# Patient Record
Sex: Male | Born: 1940 | Race: Black or African American | Hispanic: No | Marital: Married | Smoking: Former smoker
Health system: Southern US, Community
[De-identification: ages and names within clinical notes are randomized; demographics above are authoritative.]

## PROBLEM LIST (undated history)

## (undated) DIAGNOSIS — R310 Gross hematuria: Secondary | ICD-10-CM

## (undated) DIAGNOSIS — R7881 Bacteremia: Secondary | ICD-10-CM

## (undated) DIAGNOSIS — M3390 Dermatopolymyositis, unspecified, organ involvement unspecified: Secondary | ICD-10-CM

## (undated) DIAGNOSIS — Z992 Dependence on renal dialysis: Secondary | ICD-10-CM

## (undated) DIAGNOSIS — R972 Elevated prostate specific antigen [PSA]: Secondary | ICD-10-CM

## (undated) DIAGNOSIS — N289 Disorder of kidney and ureter, unspecified: Secondary | ICD-10-CM

## (undated) DIAGNOSIS — C61 Malignant neoplasm of prostate: Secondary | ICD-10-CM

## (undated) DIAGNOSIS — N3289 Other specified disorders of bladder: Secondary | ICD-10-CM

## (undated) DIAGNOSIS — N529 Male erectile dysfunction, unspecified: Secondary | ICD-10-CM

## (undated) DIAGNOSIS — I10 Essential (primary) hypertension: Secondary | ICD-10-CM

## (undated) DIAGNOSIS — R350 Frequency of micturition: Secondary | ICD-10-CM

## (undated) DIAGNOSIS — N400 Enlarged prostate without lower urinary tract symptoms: Secondary | ICD-10-CM

## (undated) DIAGNOSIS — R300 Dysuria: Secondary | ICD-10-CM

## (undated) HISTORY — DX: Elevated prostate specific antigen (PSA): R97.20

## (undated) HISTORY — DX: Bacteremia: R78.81

## (undated) HISTORY — DX: Essential (primary) hypertension: I10

## (undated) HISTORY — DX: Dermatopolymyositis, unspecified, organ involvement unspecified: M33.90

## (undated) HISTORY — PX: COLON RESECTION: SHX5231

## (undated) HISTORY — DX: Frequency of micturition: R35.0

## (undated) HISTORY — DX: Other specified disorders of bladder: N32.89

## (undated) HISTORY — DX: Dysuria: R30.0

## (undated) HISTORY — DX: Disorder of kidney and ureter, unspecified: N28.9

## (undated) HISTORY — DX: Male erectile dysfunction, unspecified: N52.9

## (undated) HISTORY — DX: Benign prostatic hyperplasia without lower urinary tract symptoms: N40.0

## (undated) HISTORY — DX: Gross hematuria: R31.0

## (undated) HISTORY — DX: Malignant neoplasm of prostate: C61

## (undated) HISTORY — DX: Dependence on renal dialysis: Z99.2

## (undated) HISTORY — PX: PROSTATE SURGERY: SHX751

---

## 2006-08-02 ENCOUNTER — Ambulatory Visit: Payer: Self-pay | Admitting: Internal Medicine

## 2006-08-29 ENCOUNTER — Ambulatory Visit: Payer: Self-pay | Admitting: Internal Medicine

## 2007-01-24 ENCOUNTER — Ambulatory Visit: Payer: Self-pay | Admitting: Gastroenterology

## 2007-02-04 ENCOUNTER — Other Ambulatory Visit: Payer: Self-pay

## 2007-02-04 ENCOUNTER — Ambulatory Visit: Payer: Self-pay | Admitting: General Surgery

## 2007-02-08 ENCOUNTER — Inpatient Hospital Stay: Payer: Self-pay | Admitting: General Surgery

## 2007-03-04 ENCOUNTER — Ambulatory Visit: Payer: Self-pay | Admitting: Cardiology

## 2007-06-01 ENCOUNTER — Ambulatory Visit: Payer: Self-pay | Admitting: Family Medicine

## 2007-06-02 ENCOUNTER — Ambulatory Visit: Payer: Self-pay | Admitting: Family Medicine

## 2007-07-01 ENCOUNTER — Ambulatory Visit: Payer: Self-pay | Admitting: Internal Medicine

## 2007-08-14 ENCOUNTER — Ambulatory Visit: Payer: Self-pay | Admitting: Family Medicine

## 2007-08-15 ENCOUNTER — Inpatient Hospital Stay: Payer: Self-pay | Admitting: Internal Medicine

## 2008-08-14 ENCOUNTER — Ambulatory Visit: Payer: Self-pay | Admitting: Internal Medicine

## 2009-06-20 ENCOUNTER — Ambulatory Visit: Payer: Self-pay | Admitting: Internal Medicine

## 2009-06-25 ENCOUNTER — Ambulatory Visit: Payer: Self-pay | Admitting: Radiation Oncology

## 2009-07-19 ENCOUNTER — Ambulatory Visit: Payer: Self-pay | Admitting: Radiation Oncology

## 2009-07-25 ENCOUNTER — Ambulatory Visit: Payer: Self-pay | Admitting: Radiation Oncology

## 2009-08-25 ENCOUNTER — Ambulatory Visit: Payer: Self-pay | Admitting: Radiation Oncology

## 2009-09-20 ENCOUNTER — Ambulatory Visit: Payer: Self-pay | Admitting: Family Medicine

## 2009-09-24 ENCOUNTER — Ambulatory Visit: Payer: Self-pay | Admitting: Radiation Oncology

## 2009-09-29 ENCOUNTER — Ambulatory Visit: Payer: Self-pay | Admitting: Radiation Oncology

## 2009-10-01 ENCOUNTER — Ambulatory Visit: Payer: Self-pay | Admitting: Radiation Oncology

## 2009-10-13 ENCOUNTER — Ambulatory Visit: Payer: Self-pay | Admitting: Urology

## 2009-10-25 ENCOUNTER — Ambulatory Visit: Payer: Self-pay | Admitting: Radiation Oncology

## 2009-11-25 ENCOUNTER — Ambulatory Visit: Payer: Self-pay | Admitting: Radiation Oncology

## 2010-02-24 ENCOUNTER — Ambulatory Visit: Payer: Self-pay | Admitting: Radiation Oncology

## 2010-02-25 LAB — PSA

## 2010-03-27 ENCOUNTER — Ambulatory Visit: Payer: Self-pay | Admitting: Radiation Oncology

## 2010-07-24 ENCOUNTER — Ambulatory Visit: Payer: Self-pay | Admitting: Family Medicine

## 2010-08-08 ENCOUNTER — Ambulatory Visit: Payer: Self-pay | Admitting: Ophthalmology

## 2010-08-16 ENCOUNTER — Ambulatory Visit: Payer: Self-pay | Admitting: Ophthalmology

## 2010-08-26 ENCOUNTER — Ambulatory Visit: Payer: Self-pay | Admitting: Radiation Oncology

## 2010-08-27 LAB — PSA

## 2010-09-25 ENCOUNTER — Ambulatory Visit: Payer: Self-pay | Admitting: Radiation Oncology

## 2010-10-12 ENCOUNTER — Ambulatory Visit: Payer: Self-pay | Admitting: Ophthalmology

## 2010-10-24 ENCOUNTER — Ambulatory Visit: Payer: Self-pay | Admitting: Ophthalmology

## 2011-03-03 ENCOUNTER — Ambulatory Visit: Payer: Self-pay | Admitting: Radiation Oncology

## 2011-03-04 LAB — PSA: PSA: 1.8 ng/mL (ref 0.0–4.0)

## 2011-03-28 ENCOUNTER — Ambulatory Visit: Payer: Self-pay | Admitting: Radiation Oncology

## 2011-07-11 ENCOUNTER — Ambulatory Visit: Payer: Self-pay | Admitting: Nephrology

## 2011-07-21 ENCOUNTER — Ambulatory Visit: Payer: Self-pay | Admitting: Internal Medicine

## 2011-09-20 ENCOUNTER — Ambulatory Visit: Payer: Self-pay | Admitting: Radiation Oncology

## 2011-09-25 ENCOUNTER — Ambulatory Visit: Payer: Self-pay | Admitting: Radiation Oncology

## 2011-11-24 ENCOUNTER — Ambulatory Visit: Payer: Self-pay | Admitting: Gastroenterology

## 2012-01-06 ENCOUNTER — Emergency Department: Payer: Self-pay | Admitting: Emergency Medicine

## 2012-01-06 LAB — COMPREHENSIVE METABOLIC PANEL
Albumin: 3.3 g/dL — ABNORMAL LOW (ref 3.4–5.0)
Anion Gap: 10 (ref 7–16)
BUN: 30 mg/dL — ABNORMAL HIGH (ref 7–18)
Calcium, Total: 8.2 mg/dL — ABNORMAL LOW (ref 8.5–10.1)
Chloride: 99 mmol/L (ref 98–107)
Co2: 28 mmol/L (ref 21–32)
EGFR (African American): 9 — ABNORMAL LOW
EGFR (Non-African Amer.): 7 — ABNORMAL LOW
Glucose: 133 mg/dL — ABNORMAL HIGH (ref 65–99)
Potassium: 3.6 mmol/L (ref 3.5–5.1)
SGOT(AST): 13 U/L — ABNORMAL LOW (ref 15–37)
SGPT (ALT): 22 U/L (ref 12–78)
Total Protein: 7.5 g/dL (ref 6.4–8.2)

## 2012-01-06 LAB — URINALYSIS, COMPLETE
Bacteria: NONE SEEN
Blood: NEGATIVE
Glucose,UR: NEGATIVE mg/dL (ref 0–75)
Leukocyte Esterase: NEGATIVE
Ph: 7 (ref 4.5–8.0)
Protein: 100
RBC,UR: 1 /HPF (ref 0–5)
Specific Gravity: 1.014 (ref 1.003–1.030)
Squamous Epithelial: 1
WBC UR: 1 /HPF (ref 0–5)

## 2012-01-06 LAB — CBC
HGB: 10.7 g/dL — ABNORMAL LOW (ref 13.0–18.0)
MCV: 96 fL (ref 80–100)
Platelet: 162 10*3/uL (ref 150–440)
RBC: 3.36 10*6/uL — ABNORMAL LOW (ref 4.40–5.90)
RDW: 14.8 % — ABNORMAL HIGH (ref 11.5–14.5)
WBC: 9.7 10*3/uL (ref 3.8–10.6)

## 2012-01-07 LAB — URINE CULTURE

## 2012-01-12 LAB — CULTURE, BLOOD (SINGLE)

## 2012-04-05 ENCOUNTER — Other Ambulatory Visit: Payer: Self-pay | Admitting: Family Medicine

## 2012-04-06 ENCOUNTER — Inpatient Hospital Stay: Payer: Self-pay | Admitting: Internal Medicine

## 2012-04-06 LAB — CBC WITH DIFFERENTIAL/PLATELET
Basophil #: 0 10*3/uL (ref 0.0–0.1)
Basophil %: 0.3 %
HCT: 31 % — ABNORMAL LOW (ref 40.0–52.0)
MCH: 31.3 pg (ref 26.0–34.0)
Monocyte %: 7.8 %
Neutrophil %: 82.4 %
Platelet: 161 10*3/uL (ref 150–440)
RDW: 14.3 % (ref 11.5–14.5)
WBC: 12.6 10*3/uL — ABNORMAL HIGH (ref 3.8–10.6)

## 2012-04-06 LAB — COMPREHENSIVE METABOLIC PANEL
Alkaline Phosphatase: 76 U/L (ref 50–136)
Calcium, Total: 8.3 mg/dL — ABNORMAL LOW (ref 8.5–10.1)
Co2: 30 mmol/L (ref 21–32)
EGFR (African American): 8 — ABNORMAL LOW
EGFR (Non-African Amer.): 6 — ABNORMAL LOW
Glucose: 98 mg/dL (ref 65–99)
Potassium: 3.8 mmol/L (ref 3.5–5.1)
SGOT(AST): 20 U/L (ref 15–37)
Sodium: 138 mmol/L (ref 136–145)
Total Protein: 8.1 g/dL (ref 6.4–8.2)

## 2012-04-06 LAB — URINALYSIS, COMPLETE
Bacteria: NONE SEEN
Glucose,UR: NEGATIVE mg/dL (ref 0–75)
Hyaline Cast: 1
Ketone: NEGATIVE
Leukocyte Esterase: NEGATIVE
Nitrite: NEGATIVE
Ph: 7 (ref 4.5–8.0)
Protein: 100
Specific Gravity: 1.013 (ref 1.003–1.030)
Squamous Epithelial: 1
WBC UR: 2 /HPF (ref 0–5)

## 2012-04-06 LAB — SEDIMENTATION RATE: Erythrocyte Sed Rate: 108 mm/hr — ABNORMAL HIGH (ref 0–20)

## 2012-04-08 LAB — RENAL FUNCTION PANEL
Albumin: 2.7 g/dL — ABNORMAL LOW (ref 3.4–5.0)
Anion Gap: 13 (ref 7–16)
BUN: 65 mg/dL — ABNORMAL HIGH (ref 7–18)
Creatinine: 10.78 mg/dL — ABNORMAL HIGH (ref 0.60–1.30)
EGFR (Non-African Amer.): 4 — ABNORMAL LOW
Glucose: 102 mg/dL — ABNORMAL HIGH (ref 65–99)
Osmolality: 295 (ref 275–301)
Potassium: 3.5 mmol/L (ref 3.5–5.1)

## 2012-04-08 LAB — CBC WITH DIFFERENTIAL/PLATELET
Eosinophil #: 0.2 10*3/uL (ref 0.0–0.7)
HCT: 27.2 % — ABNORMAL LOW (ref 40.0–52.0)
HGB: 9.1 g/dL — ABNORMAL LOW (ref 13.0–18.0)
Lymphocyte #: 1.5 10*3/uL (ref 1.0–3.6)
Lymphocyte %: 19.6 %
MCV: 96 fL (ref 80–100)
Monocyte %: 9.7 %
Neutrophil #: 5.2 10*3/uL (ref 1.4–6.5)
Neutrophil %: 67 %
Platelet: 159 10*3/uL (ref 150–440)
WBC: 7.8 10*3/uL (ref 3.8–10.6)

## 2012-04-08 LAB — CULTURE, BLOOD (SINGLE)

## 2012-04-09 LAB — CULTURE, BLOOD (SINGLE)

## 2012-04-10 LAB — CBC WITH DIFFERENTIAL/PLATELET
Basophil #: 0.1 10*3/uL (ref 0.0–0.1)
Eosinophil #: 0.2 10*3/uL (ref 0.0–0.7)
HGB: 9.5 g/dL — ABNORMAL LOW (ref 13.0–18.0)
Lymphocyte #: 1.6 10*3/uL (ref 1.0–3.6)
Lymphocyte %: 20.8 %
MCH: 30.8 pg (ref 26.0–34.0)
MCHC: 32.3 g/dL (ref 32.0–36.0)
MCV: 95 fL (ref 80–100)
Monocyte #: 0.7 x10 3/mm (ref 0.2–1.0)
Monocyte %: 9.2 %
RBC: 3.07 10*6/uL — ABNORMAL LOW (ref 4.40–5.90)
WBC: 7.5 10*3/uL (ref 3.8–10.6)

## 2012-04-10 LAB — CULTURE, BLOOD (SINGLE)

## 2012-04-14 LAB — CULTURE, BLOOD (SINGLE)

## 2012-05-25 ENCOUNTER — Inpatient Hospital Stay: Payer: Self-pay | Admitting: Internal Medicine

## 2012-05-25 LAB — COMPREHENSIVE METABOLIC PANEL
Albumin: 3.4 g/dL (ref 3.4–5.0)
Alkaline Phosphatase: 66 U/L (ref 50–136)
Anion Gap: 8 (ref 7–16)
Bilirubin,Total: 0.5 mg/dL (ref 0.2–1.0)
Calcium, Total: 8.2 mg/dL — ABNORMAL LOW (ref 8.5–10.1)
Co2: 29 mmol/L (ref 21–32)
EGFR (African American): 6 — ABNORMAL LOW
Glucose: 117 mg/dL — ABNORMAL HIGH (ref 65–99)
SGOT(AST): 16 U/L (ref 15–37)
SGPT (ALT): 16 U/L (ref 12–78)

## 2012-05-25 LAB — URINALYSIS, COMPLETE
Blood: NEGATIVE
Glucose,UR: NEGATIVE mg/dL (ref 0–75)
Ketone: NEGATIVE
Ph: 7 (ref 4.5–8.0)
Protein: 100
RBC,UR: <1 /HPF (ref 0–5)
Squamous Epithelial: <1
WBC UR: 3 /HPF (ref 0–5)

## 2012-05-25 LAB — CBC
HCT: 30.6 % — ABNORMAL LOW (ref 40.0–52.0)
HGB: 10.1 g/dL — ABNORMAL LOW (ref 13.0–18.0)
MCV: 97 fL (ref 80–100)
Platelet: 160 10*3/uL (ref 150–440)
RDW: 14.9 % — ABNORMAL HIGH (ref 11.5–14.5)

## 2012-05-26 LAB — BASIC METABOLIC PANEL
BUN: 49 mg/dL — ABNORMAL HIGH (ref 7–18)
Calcium, Total: 7.9 mg/dL — ABNORMAL LOW (ref 8.5–10.1)
Chloride: 103 mmol/L (ref 98–107)
Co2: 27 mmol/L (ref 21–32)
Creatinine: 10.2 mg/dL — ABNORMAL HIGH (ref 0.60–1.30)
EGFR (Non-African Amer.): 5 — ABNORMAL LOW
Glucose: 105 mg/dL — ABNORMAL HIGH (ref 65–99)
Osmolality: 291 (ref 275–301)
Potassium: 3.9 mmol/L (ref 3.5–5.1)
Sodium: 139 mmol/L (ref 136–145)

## 2012-05-26 LAB — CBC WITH DIFFERENTIAL/PLATELET
Basophil %: 0.4 %
Eosinophil %: 0.5 %
HCT: 31.6 % — ABNORMAL LOW (ref 40.0–52.0)
Lymphocyte #: 0.6 10*3/uL — ABNORMAL LOW (ref 1.0–3.6)
MCV: 96 fL (ref 80–100)
Monocyte #: 0.4 x10 3/mm (ref 0.2–1.0)
Monocyte %: 4.4 %
Platelet: 156 10*3/uL (ref 150–440)
RDW: 14.8 % — ABNORMAL HIGH (ref 11.5–14.5)
WBC: 9.9 10*3/uL (ref 3.8–10.6)

## 2012-05-26 LAB — RAPID INFLUENZA A&B ANTIGENS

## 2012-05-27 LAB — CBC WITH DIFFERENTIAL/PLATELET
Basophil %: 1 %
Eosinophil %: 1.7 %
HCT: 27.6 % — ABNORMAL LOW (ref 40.0–52.0)
HGB: 9.3 g/dL — ABNORMAL LOW (ref 13.0–18.0)
MCHC: 33.8 g/dL (ref 32.0–36.0)
MCV: 96 fL (ref 80–100)
Platelet: 147 10*3/uL — ABNORMAL LOW (ref 150–440)
WBC: 9.4 10*3/uL (ref 3.8–10.6)

## 2012-05-27 LAB — PHOSPHORUS: Phosphorus: 4 mg/dL (ref 2.5–4.9)

## 2012-05-27 LAB — BASIC METABOLIC PANEL
Anion Gap: 9 (ref 7–16)
EGFR (African American): 4 — ABNORMAL LOW
EGFR (Non-African Amer.): 4 — ABNORMAL LOW
Osmolality: 293 (ref 275–301)
Potassium: 4 mmol/L (ref 3.5–5.1)

## 2012-05-27 LAB — URINE CULTURE

## 2012-05-28 LAB — CBC WITH DIFFERENTIAL/PLATELET
Basophil #: 0 10*3/uL (ref 0.0–0.1)
Basophil %: 0.7 %
Eosinophil #: 0.3 10*3/uL (ref 0.0–0.7)
Eosinophil %: 3.9 %
HGB: 10.1 g/dL — ABNORMAL LOW (ref 13.0–18.0)
Lymphocyte #: 1.6 10*3/uL (ref 1.0–3.6)
Lymphocyte %: 21.7 %
MCH: 33.5 pg (ref 26.0–34.0)
MCV: 96 fL (ref 80–100)
Monocyte #: 0.9 x10 3/mm (ref 0.2–1.0)
Monocyte %: 12.3 %
Platelet: 168 10*3/uL (ref 150–440)
RBC: 3.02 10*6/uL — ABNORMAL LOW (ref 4.40–5.90)
WBC: 7.4 10*3/uL (ref 3.8–10.6)

## 2012-05-28 LAB — PHOSPHORUS: Phosphorus: 3.9 mg/dL (ref 2.5–4.9)

## 2012-05-28 LAB — CULTURE, BLOOD (SINGLE)

## 2012-05-29 LAB — CULTURE, BLOOD (SINGLE)

## 2012-05-31 LAB — CULTURE, BLOOD (SINGLE)

## 2012-08-25 ENCOUNTER — Ambulatory Visit: Payer: Self-pay | Admitting: Radiation Oncology

## 2012-09-23 ENCOUNTER — Ambulatory Visit: Payer: Self-pay

## 2012-10-18 ENCOUNTER — Ambulatory Visit: Payer: Self-pay | Admitting: Cardiology

## 2012-11-06 ENCOUNTER — Ambulatory Visit: Payer: Self-pay | Admitting: Urology

## 2012-11-07 LAB — URINE CULTURE

## 2012-11-13 ENCOUNTER — Ambulatory Visit: Payer: Self-pay | Admitting: Urology

## 2012-11-13 LAB — POTASSIUM: Potassium: 4.5 mmol/L (ref 3.5–5.1)

## 2012-11-23 ENCOUNTER — Other Ambulatory Visit: Payer: Self-pay | Admitting: Internal Medicine

## 2013-02-12 ENCOUNTER — Ambulatory Visit: Payer: Self-pay | Admitting: Urology

## 2013-08-13 ENCOUNTER — Observation Stay: Payer: Self-pay | Admitting: Internal Medicine

## 2013-08-13 LAB — CBC
HCT: 34.5 % — ABNORMAL LOW (ref 40.0–52.0)
HGB: 11.3 g/dL — AB (ref 13.0–18.0)
MCH: 31.2 pg (ref 26.0–34.0)
MCHC: 32.7 g/dL (ref 32.0–36.0)
MCV: 95 fL (ref 80–100)
Platelet: 177 10*3/uL (ref 150–440)
RBC: 3.62 10*6/uL — ABNORMAL LOW (ref 4.40–5.90)
RDW: 14.1 % (ref 11.5–14.5)
WBC: 7.2 10*3/uL (ref 3.8–10.6)

## 2013-08-13 LAB — BASIC METABOLIC PANEL
Anion Gap: 8 (ref 7–16)
BUN: 49 mg/dL — ABNORMAL HIGH (ref 7–18)
CO2: 26 mmol/L (ref 21–32)
Calcium, Total: 9 mg/dL (ref 8.5–10.1)
Chloride: 105 mmol/L (ref 98–107)
Creatinine: 11.41 mg/dL — ABNORMAL HIGH (ref 0.60–1.30)
EGFR (African American): 5 — ABNORMAL LOW
GFR CALC NON AF AMER: 4 — AB
Glucose: 98 mg/dL (ref 65–99)
OSMOLALITY: 290 (ref 275–301)
Potassium: 3.8 mmol/L (ref 3.5–5.1)
SODIUM: 139 mmol/L (ref 136–145)

## 2013-08-13 LAB — PRO B NATRIURETIC PEPTIDE: B-Type Natriuretic Peptide: 6321 pg/mL — ABNORMAL HIGH (ref 0–125)

## 2013-08-13 LAB — TROPONIN I: Troponin-I: 0.1 ng/mL — ABNORMAL HIGH

## 2013-08-13 LAB — CK-MB

## 2013-08-14 LAB — TROPONIN I
Troponin-I: 0.09 ng/mL — ABNORMAL HIGH
Troponin-I: 0.08 ng/mL — ABNORMAL HIGH

## 2013-08-14 LAB — LIPID PANEL
Cholesterol: 142 mg/dL
HDL Cholesterol: 42 mg/dL
Ldl Cholesterol, Calc: 80 mg/dL
Triglycerides: 100 mg/dL
VLDL Cholesterol, Calc: 20 mg/dL

## 2013-08-14 LAB — CK-MB
CK-MB: <0.5 ng/mL — ABNORMAL LOW (ref 0.5–3.6)
CK-MB: <0.5 ng/mL — ABNORMAL LOW

## 2013-08-14 LAB — PHOSPHORUS: PHOSPHORUS: 4.2 mg/dL (ref 2.5–4.9)

## 2013-08-14 LAB — D-DIMER(ARMC): D-Dimer: 1015 ng/ml

## 2013-08-27 ENCOUNTER — Ambulatory Visit: Payer: Self-pay | Admitting: Urology

## 2013-10-05 ENCOUNTER — Inpatient Hospital Stay: Payer: Self-pay | Admitting: Internal Medicine

## 2013-10-05 LAB — CBC
HCT: 28.6 % — ABNORMAL LOW (ref 40.0–52.0)
HGB: 9.3 g/dL — ABNORMAL LOW (ref 13.0–18.0)
MCH: 30.6 pg (ref 26.0–34.0)
MCHC: 32.7 g/dL (ref 32.0–36.0)
MCV: 94 fL (ref 80–100)
Platelet: 180 10*3/uL (ref 150–440)
RBC: 3.05 10*6/uL — AB (ref 4.40–5.90)
RDW: 14.8 % — AB (ref 11.5–14.5)
WBC: 12.9 10*3/uL — ABNORMAL HIGH (ref 3.8–10.6)

## 2013-10-05 LAB — BASIC METABOLIC PANEL
Anion Gap: 7 (ref 7–16)
BUN: 32 mg/dL — AB (ref 7–18)
CHLORIDE: 101 mmol/L (ref 98–107)
Calcium, Total: 8 mg/dL — ABNORMAL LOW (ref 8.5–10.1)
Co2: 31 mmol/L (ref 21–32)
Creatinine: 8.8 mg/dL — ABNORMAL HIGH (ref 0.60–1.30)
EGFR (African American): 6 — ABNORMAL LOW
EGFR (Non-African Amer.): 5 — ABNORMAL LOW
GLUCOSE: 118 mg/dL — AB (ref 65–99)
OSMOLALITY: 286 (ref 275–301)
Potassium: 3.9 mmol/L (ref 3.5–5.1)
SODIUM: 139 mmol/L (ref 136–145)

## 2013-10-05 LAB — CK-MB
CK-MB: 26.6 ng/mL — ABNORMAL HIGH (ref 0.5–3.6)
CK-MB: 32.7 ng/mL — ABNORMAL HIGH (ref 0.5–3.6)
CK-MB: 35.3 ng/mL — ABNORMAL HIGH (ref 0.5–3.6)

## 2013-10-05 LAB — TROPONIN I
TROPONIN-I: 36 ng/mL — AB
Troponin-I: 14 ng/mL — ABNORMAL HIGH
Troponin-I: 24 ng/mL — ABNORMAL HIGH

## 2013-10-05 LAB — PHOSPHORUS: Phosphorus: 3.7 mg/dL (ref 2.5–4.9)

## 2013-10-05 LAB — PROTIME-INR
INR: 1.1
Prothrombin Time: 14 secs (ref 11.5–14.7)

## 2013-10-05 LAB — APTT: ACTIVATED PTT: 36.2 s — AB (ref 23.6–35.9)

## 2013-10-05 LAB — HEPARIN LEVEL (UNFRACTIONATED): Anti-Xa(Unfractionated): 0.16 [IU]/mL — ABNORMAL LOW (ref 0.30–0.70)

## 2013-10-06 LAB — COMPREHENSIVE METABOLIC PANEL
ALK PHOS: 41 U/L — AB
ALT: 18 U/L (ref 12–78)
Albumin: 2.2 g/dL — ABNORMAL LOW (ref 3.4–5.0)
Anion Gap: 9 (ref 7–16)
BILIRUBIN TOTAL: 0.8 mg/dL (ref 0.2–1.0)
BUN: 30 mg/dL — ABNORMAL HIGH (ref 7–18)
Calcium, Total: 8.1 mg/dL — ABNORMAL LOW (ref 8.5–10.1)
Chloride: 99 mmol/L (ref 98–107)
Co2: 31 mmol/L (ref 21–32)
Creatinine: 7.53 mg/dL — ABNORMAL HIGH (ref 0.60–1.30)
EGFR (African American): 8 — ABNORMAL LOW
EGFR (Non-African Amer.): 6 — ABNORMAL LOW
Glucose: 108 mg/dL — ABNORMAL HIGH (ref 65–99)
OSMOLALITY: 284 (ref 275–301)
Potassium: 4.1 mmol/L (ref 3.5–5.1)
SGOT(AST): 43 U/L — ABNORMAL HIGH (ref 15–37)
SODIUM: 139 mmol/L (ref 136–145)
TOTAL PROTEIN: 6.3 g/dL — AB (ref 6.4–8.2)

## 2013-10-06 LAB — CBC WITH DIFFERENTIAL/PLATELET
BASOS ABS: 0 10*3/uL (ref 0.0–0.1)
BASOS PCT: 0.3 %
EOS ABS: 0 10*3/uL (ref 0.0–0.7)
Eosinophil %: 0.4 %
HCT: 26.3 % — AB (ref 40.0–52.0)
HGB: 8.5 g/dL — AB (ref 13.0–18.0)
LYMPHS ABS: 1.1 10*3/uL (ref 1.0–3.6)
Lymphocyte %: 9.4 %
MCH: 30.1 pg (ref 26.0–34.0)
MCHC: 32.3 g/dL (ref 32.0–36.0)
MCV: 93 fL (ref 80–100)
Monocyte #: 0.8 x10 3/mm (ref 0.2–1.0)
Monocyte %: 7.3 %
NEUTROS PCT: 82.6 %
Neutrophil #: 9.5 10*3/uL — ABNORMAL HIGH (ref 1.4–6.5)
Platelet: 156 10*3/uL (ref 150–440)
RBC: 2.82 10*6/uL — ABNORMAL LOW (ref 4.40–5.90)
RDW: 14.7 % — AB (ref 11.5–14.5)
WBC: 11.5 10*3/uL — ABNORMAL HIGH (ref 3.8–10.6)

## 2013-10-06 LAB — PHOSPHORUS: PHOSPHORUS: 4.4 mg/dL (ref 2.5–4.9)

## 2013-10-06 LAB — HEPARIN LEVEL (UNFRACTIONATED): ANTI-XA(UNFRACTIONATED): 0.34 [IU]/mL (ref 0.30–0.70)

## 2013-10-07 LAB — PHOSPHORUS: Phosphorus: 5.8 mg/dL — ABNORMAL HIGH (ref 2.5–4.9)

## 2013-10-09 LAB — CULTURE, BLOOD (SINGLE)

## 2013-10-10 LAB — CULTURE, BLOOD (SINGLE)

## 2013-10-12 ENCOUNTER — Inpatient Hospital Stay: Payer: Self-pay | Admitting: Internal Medicine

## 2013-10-12 LAB — CBC
HCT: 32.5 % — ABNORMAL LOW (ref 40.0–52.0)
HGB: 10.1 g/dL — AB (ref 13.0–18.0)
MCH: 29.4 pg (ref 26.0–34.0)
MCHC: 31 g/dL — ABNORMAL LOW (ref 32.0–36.0)
MCV: 95 fL (ref 80–100)
Platelet: 321 10*3/uL (ref 150–440)
RBC: 3.42 10*6/uL — ABNORMAL LOW (ref 4.40–5.90)
RDW: 15.5 % — ABNORMAL HIGH (ref 11.5–14.5)
WBC: 17.6 10*3/uL — ABNORMAL HIGH (ref 3.8–10.6)

## 2013-10-12 LAB — COMPREHENSIVE METABOLIC PANEL
ALT: 19 U/L (ref 12–78)
ANION GAP: 13 (ref 7–16)
AST: 25 U/L (ref 15–37)
Albumin: 2.9 g/dL — ABNORMAL LOW (ref 3.4–5.0)
Alkaline Phosphatase: 64 U/L
BILIRUBIN TOTAL: 0.4 mg/dL (ref 0.2–1.0)
BUN: 67 mg/dL — ABNORMAL HIGH (ref 7–18)
CO2: 24 mmol/L (ref 21–32)
Calcium, Total: 8.6 mg/dL (ref 8.5–10.1)
Chloride: 103 mmol/L (ref 98–107)
Creatinine: 12.24 mg/dL — ABNORMAL HIGH (ref 0.60–1.30)
EGFR (African American): 4 — ABNORMAL LOW
EGFR (Non-African Amer.): 4 — ABNORMAL LOW
Glucose: 148 mg/dL — ABNORMAL HIGH (ref 65–99)
Osmolality: 302 (ref 275–301)
Potassium: 4.5 mmol/L (ref 3.5–5.1)
Sodium: 140 mmol/L (ref 136–145)
Total Protein: 7.8 g/dL (ref 6.4–8.2)

## 2013-10-12 LAB — CK-MB
CK-MB: 0.9 ng/mL (ref 0.5–3.6)
CK-MB: 1.5 ng/mL (ref 0.5–3.6)
CK-MB: 1.9 ng/mL (ref 0.5–3.6)

## 2013-10-12 LAB — CK TOTAL AND CKMB (NOT AT ARMC)
CK, TOTAL: 90 U/L
CK-MB: 0.8 ng/mL (ref 0.5–3.6)

## 2013-10-12 LAB — TROPONIN I: TROPONIN-I: 7.4 ng/mL — AB

## 2013-10-12 LAB — PRO B NATRIURETIC PEPTIDE: B-TYPE NATIURETIC PEPTID: 31721 pg/mL — AB (ref 0–125)

## 2013-10-13 DIAGNOSIS — R0602 Shortness of breath: Secondary | ICD-10-CM

## 2013-10-13 DIAGNOSIS — J9600 Acute respiratory failure, unspecified whether with hypoxia or hypercapnia: Secondary | ICD-10-CM

## 2013-10-13 LAB — CBC WITH DIFFERENTIAL/PLATELET
BASOS ABS: 0.1 10*3/uL (ref 0.0–0.1)
Basophil %: 0.7 %
EOS PCT: 2.6 %
Eosinophil #: 0.2 10*3/uL (ref 0.0–0.7)
HCT: 27.7 % — AB (ref 40.0–52.0)
HGB: 8.7 g/dL — ABNORMAL LOW (ref 13.0–18.0)
Lymphocyte #: 1.4 10*3/uL (ref 1.0–3.6)
Lymphocyte %: 16.7 %
MCH: 29.6 pg (ref 26.0–34.0)
MCHC: 31.5 g/dL — AB (ref 32.0–36.0)
MCV: 94 fL (ref 80–100)
Monocyte #: 0.7 x10 3/mm (ref 0.2–1.0)
Monocyte %: 8.9 %
NEUTROS ABS: 5.8 10*3/uL (ref 1.4–6.5)
Neutrophil %: 71.1 %
PLATELETS: 220 10*3/uL (ref 150–440)
RBC: 2.95 10*6/uL — ABNORMAL LOW (ref 4.40–5.90)
RDW: 15.1 % — ABNORMAL HIGH (ref 11.5–14.5)
WBC: 8.1 10*3/uL (ref 3.8–10.6)

## 2013-10-13 LAB — URINALYSIS, COMPLETE
BLOOD: NEGATIVE
Bacteria: NONE SEEN
Bilirubin,UR: NEGATIVE
GLUCOSE, UR: NEGATIVE mg/dL (ref 0–75)
KETONE: NEGATIVE
Leukocyte Esterase: NEGATIVE
Nitrite: NEGATIVE
PH: 9 (ref 4.5–8.0)
Protein: 100
Specific Gravity: 1.011 (ref 1.003–1.030)

## 2013-10-13 LAB — BASIC METABOLIC PANEL
ANION GAP: 7 (ref 7–16)
BUN: 39 mg/dL — ABNORMAL HIGH (ref 7–18)
CALCIUM: 8 mg/dL — AB (ref 8.5–10.1)
Chloride: 101 mmol/L (ref 98–107)
Co2: 32 mmol/L (ref 21–32)
Creatinine: 8.35 mg/dL — ABNORMAL HIGH (ref 0.60–1.30)
EGFR (African American): 7 — ABNORMAL LOW
EGFR (Non-African Amer.): 6 — ABNORMAL LOW
Glucose: 89 mg/dL (ref 65–99)
OSMOLALITY: 288 (ref 275–301)
Potassium: 4.1 mmol/L (ref 3.5–5.1)
Sodium: 140 mmol/L (ref 136–145)

## 2013-10-13 LAB — PHOSPHORUS: Phosphorus: 4.1 mg/dL (ref 2.5–4.9)

## 2013-10-13 LAB — VANCOMYCIN, TROUGH: VANCOMYCIN, TROUGH: 19 ug/mL (ref 10–20)

## 2013-10-13 LAB — TROPONIN I: Troponin-I: 5.1 ng/mL — ABNORMAL HIGH

## 2013-10-14 LAB — BASIC METABOLIC PANEL
ANION GAP: 6 — AB (ref 7–16)
BUN: 36 mg/dL — AB (ref 7–18)
Calcium, Total: 8.1 mg/dL — ABNORMAL LOW (ref 8.5–10.1)
Chloride: 99 mmol/L (ref 98–107)
Co2: 35 mmol/L — ABNORMAL HIGH (ref 21–32)
Creatinine: 8.47 mg/dL — ABNORMAL HIGH (ref 0.60–1.30)
EGFR (African American): 7 — ABNORMAL LOW
GFR CALC NON AF AMER: 6 — AB
Glucose: 107 mg/dL — ABNORMAL HIGH (ref 65–99)
Osmolality: 288 (ref 275–301)
POTASSIUM: 3.9 mmol/L (ref 3.5–5.1)
SODIUM: 140 mmol/L (ref 136–145)

## 2013-10-14 LAB — CBC WITH DIFFERENTIAL/PLATELET
Basophil #: 0.1 10*3/uL (ref 0.0–0.1)
Basophil %: 0.8 %
EOS PCT: 1.8 %
Eosinophil #: 0.2 10*3/uL (ref 0.0–0.7)
HCT: 27.2 % — ABNORMAL LOW (ref 40.0–52.0)
HGB: 8.8 g/dL — ABNORMAL LOW (ref 13.0–18.0)
Lymphocyte #: 1.4 10*3/uL (ref 1.0–3.6)
Lymphocyte %: 15.1 %
MCH: 30.2 pg (ref 26.0–34.0)
MCHC: 32.5 g/dL (ref 32.0–36.0)
MCV: 93 fL (ref 80–100)
MONOS PCT: 9.8 %
Monocyte #: 0.9 x10 3/mm (ref 0.2–1.0)
NEUTROS ABS: 6.6 10*3/uL — AB (ref 1.4–6.5)
Neutrophil %: 72.5 %
Platelet: 224 10*3/uL (ref 150–440)
RBC: 2.93 10*6/uL — AB (ref 4.40–5.90)
RDW: 14.8 % — ABNORMAL HIGH (ref 11.5–14.5)
WBC: 9.1 10*3/uL (ref 3.8–10.6)

## 2013-10-14 LAB — URINE CULTURE

## 2013-10-14 LAB — PHOSPHORUS: Phosphorus: 3.5 mg/dL (ref 2.5–4.9)

## 2013-10-16 LAB — VANCOMYCIN, TROUGH: Vancomycin, Trough: 23 ug/mL (ref 10–20)

## 2013-10-16 LAB — PHOSPHORUS: Phosphorus: 3.5 mg/dL (ref 2.5–4.9)

## 2013-10-17 LAB — CULTURE, BLOOD (SINGLE)

## 2013-10-25 ENCOUNTER — Inpatient Hospital Stay: Payer: Self-pay | Admitting: Internal Medicine

## 2013-10-25 ENCOUNTER — Ambulatory Visit: Payer: Self-pay | Admitting: Internal Medicine

## 2013-10-25 LAB — COMPREHENSIVE METABOLIC PANEL
ALBUMIN: 2.6 g/dL — AB (ref 3.4–5.0)
ANION GAP: 10 (ref 7–16)
AST: 13 U/L — AB (ref 15–37)
Alkaline Phosphatase: 45 U/L — ABNORMAL LOW
BILIRUBIN TOTAL: 0.4 mg/dL (ref 0.2–1.0)
BUN: 16 mg/dL (ref 7–18)
CHLORIDE: 99 mmol/L (ref 98–107)
Calcium, Total: 8 mg/dL — ABNORMAL LOW (ref 8.5–10.1)
Co2: 29 mmol/L (ref 21–32)
Creatinine: 4.74 mg/dL — ABNORMAL HIGH (ref 0.60–1.30)
EGFR (African American): 13 — ABNORMAL LOW
GFR CALC NON AF AMER: 11 — AB
Glucose: 111 mg/dL — ABNORMAL HIGH (ref 65–99)
Osmolality: 278 (ref 275–301)
POTASSIUM: 3.7 mmol/L (ref 3.5–5.1)
SGPT (ALT): 13 U/L — ABNORMAL LOW
SODIUM: 138 mmol/L (ref 136–145)
Total Protein: 6.8 g/dL (ref 6.4–8.2)

## 2013-10-25 LAB — CBC
HCT: 25 % — ABNORMAL LOW (ref 40.0–52.0)
HGB: 8.1 g/dL — ABNORMAL LOW (ref 13.0–18.0)
MCH: 30.2 pg (ref 26.0–34.0)
MCHC: 32.4 g/dL (ref 32.0–36.0)
MCV: 93 fL (ref 80–100)
Platelet: 153 10*3/uL (ref 150–440)
RBC: 2.69 10*6/uL — ABNORMAL LOW (ref 4.40–5.90)
RDW: 15.4 % — AB (ref 11.5–14.5)
WBC: 6.7 10*3/uL (ref 3.8–10.6)

## 2013-10-25 LAB — TROPONIN I
TROPONIN-I: 2.2 ng/mL — AB
Troponin-I: 4.4 ng/mL — ABNORMAL HIGH
Troponin-I: 10 ng/mL — ABNORMAL HIGH

## 2013-10-25 LAB — CK TOTAL AND CKMB (NOT AT ARMC)
CK, TOTAL: 173 U/L
CK-MB: 14.3 ng/mL — AB (ref 0.5–3.6)

## 2013-10-25 LAB — CK-MB
CK-MB: 0.9 ng/mL (ref 0.5–3.6)
CK-MB: 4 ng/mL — ABNORMAL HIGH (ref 0.5–3.6)

## 2013-10-25 LAB — GENTAMICIN LEVEL, RANDOM: Gentamicin, Random: 6 ug/mL

## 2013-10-26 LAB — CBC WITH DIFFERENTIAL/PLATELET
BASOS ABS: 0.1 10*3/uL (ref 0.0–0.1)
BASOS PCT: 1 %
EOS ABS: 0.1 10*3/uL (ref 0.0–0.7)
Eosinophil %: 1.6 %
HCT: 22.7 % — AB (ref 40.0–52.0)
HGB: 7.4 g/dL — ABNORMAL LOW (ref 13.0–18.0)
LYMPHS PCT: 19.1 %
Lymphocyte #: 1.3 10*3/uL (ref 1.0–3.6)
MCH: 30.4 pg (ref 26.0–34.0)
MCHC: 32.7 g/dL (ref 32.0–36.0)
MCV: 93 fL (ref 80–100)
MONO ABS: 0.7 x10 3/mm (ref 0.2–1.0)
Monocyte %: 9.7 %
NEUTROS PCT: 68.6 %
Neutrophil #: 4.7 10*3/uL (ref 1.4–6.5)
Platelet: 136 10*3/uL — ABNORMAL LOW (ref 150–440)
RBC: 2.44 10*6/uL — ABNORMAL LOW (ref 4.40–5.90)
RDW: 15.4 % — ABNORMAL HIGH (ref 11.5–14.5)
WBC: 6.8 10*3/uL (ref 3.8–10.6)

## 2013-10-26 LAB — HEPARIN LEVEL (UNFRACTIONATED)
Anti-Xa(Unfractionated): 0.21 IU/mL — ABNORMAL LOW (ref 0.30–0.70)
Anti-Xa(Unfractionated): 0.57 IU/mL (ref 0.30–0.70)

## 2013-10-26 LAB — COMPREHENSIVE METABOLIC PANEL
ALT: 13 U/L — AB
AST: 30 U/L (ref 15–37)
Albumin: 2.4 g/dL — ABNORMAL LOW (ref 3.4–5.0)
Alkaline Phosphatase: 43 U/L — ABNORMAL LOW
Anion Gap: 9 (ref 7–16)
BUN: 23 mg/dL — AB (ref 7–18)
Bilirubin,Total: 0.4 mg/dL (ref 0.2–1.0)
Calcium, Total: 7.8 mg/dL — ABNORMAL LOW (ref 8.5–10.1)
Chloride: 102 mmol/L (ref 98–107)
Co2: 28 mmol/L (ref 21–32)
Creatinine: 6.68 mg/dL — ABNORMAL HIGH (ref 0.60–1.30)
EGFR (African American): 9 — ABNORMAL LOW
EGFR (Non-African Amer.): 7 — ABNORMAL LOW
GLUCOSE: 101 mg/dL — AB (ref 65–99)
OSMOLALITY: 281 (ref 275–301)
Potassium: 4.2 mmol/L (ref 3.5–5.1)
SODIUM: 139 mmol/L (ref 136–145)
Total Protein: 6.4 g/dL (ref 6.4–8.2)

## 2013-10-26 LAB — PROTIME-INR
INR: 1.1
Prothrombin Time: 14.4 secs (ref 11.5–14.7)

## 2013-10-26 LAB — VANCOMYCIN, RANDOM: Vancomycin, Random: 25 ug/mL

## 2013-10-26 LAB — APTT: ACTIVATED PTT: 35.4 s (ref 23.6–35.9)

## 2013-10-27 LAB — BASIC METABOLIC PANEL
Anion Gap: 7 (ref 7–16)
BUN: 40 mg/dL — ABNORMAL HIGH (ref 7–18)
CHLORIDE: 100 mmol/L (ref 98–107)
CO2: 26 mmol/L (ref 21–32)
CREATININE: 9.1 mg/dL — AB (ref 0.60–1.30)
Calcium, Total: 7.9 mg/dL — ABNORMAL LOW (ref 8.5–10.1)
EGFR (African American): 6 — ABNORMAL LOW
GFR CALC NON AF AMER: 5 — AB
Glucose: 115 mg/dL — ABNORMAL HIGH (ref 65–99)
OSMOLALITY: 277 (ref 275–301)
Potassium: 5.1 mmol/L (ref 3.5–5.1)
Sodium: 133 mmol/L — ABNORMAL LOW (ref 136–145)

## 2013-10-27 LAB — CBC WITH DIFFERENTIAL/PLATELET
BASOS ABS: 0 10*3/uL (ref 0.0–0.1)
Basophil %: 0.5 %
EOS ABS: 0 10*3/uL (ref 0.0–0.7)
Eosinophil %: 0.4 %
HCT: 24.2 % — AB (ref 40.0–52.0)
HGB: 7.8 g/dL — ABNORMAL LOW (ref 13.0–18.0)
Lymphocyte #: 1.2 10*3/uL (ref 1.0–3.6)
Lymphocyte %: 13.7 %
MCH: 30.3 pg (ref 26.0–34.0)
MCHC: 32.4 g/dL (ref 32.0–36.0)
MCV: 93 fL (ref 80–100)
Monocyte #: 0.5 x10 3/mm (ref 0.2–1.0)
Monocyte %: 6 %
Neutrophil #: 7.1 10*3/uL — ABNORMAL HIGH (ref 1.4–6.5)
Neutrophil %: 79.4 %
Platelet: 173 10*3/uL (ref 150–440)
RBC: 2.59 10*6/uL — ABNORMAL LOW (ref 4.40–5.90)
RDW: 15.9 % — AB (ref 11.5–14.5)
WBC: 9 10*3/uL (ref 3.8–10.6)

## 2013-10-27 LAB — PHOSPHORUS: Phosphorus: 6.3 mg/dL — ABNORMAL HIGH (ref 2.5–4.9)

## 2013-10-27 LAB — HEPARIN LEVEL (UNFRACTIONATED): ANTI-XA(UNFRACTIONATED): 0.23 [IU]/mL — AB (ref 0.30–0.70)

## 2013-10-27 LAB — GENTAMICIN LEVEL, TROUGH: Gentamicin, Trough: 4.2 ug/mL (ref 0.0–2.0)

## 2013-10-28 LAB — TSH: Thyroid Stimulating Horm: 1.35 u[IU]/mL

## 2013-10-28 LAB — HEPARIN LEVEL (UNFRACTIONATED)
ANTI-XA(UNFRACTIONATED): 0.1 [IU]/mL — AB (ref 0.30–0.70)
Anti-Xa(Unfractionated): <0.1 IU/mL — ABNORMAL LOW (ref 0.30–0.70)

## 2013-10-28 LAB — GENTAMICIN LEVEL, TROUGH: Gentamicin, Trough: 2.3 ug/mL (ref 0.0–2.0)

## 2013-10-28 LAB — HEMATOCRIT: HCT: 23.1 % — AB (ref 40.0–52.0)

## 2013-10-28 LAB — PHOSPHORUS: Phosphorus: 4.7 mg/dL (ref 2.5–4.9)

## 2013-10-29 ENCOUNTER — Ambulatory Visit: Admit: 2013-10-29 | Payer: Self-pay | Admitting: Internal Medicine

## 2013-10-29 ENCOUNTER — Ambulatory Visit (HOSPITAL_COMMUNITY)
Admission: AD | Admit: 2013-10-29 | Discharge: 2013-10-29 | Disposition: A | Discharge status: Home / Self Care | Payer: Medicare Other | Source: Other Acute Inpatient Hospital | Attending: Internal Medicine | Admitting: Internal Medicine

## 2013-10-29 ENCOUNTER — Ambulatory Visit (HOSPITAL_COMMUNITY): Admit: 2013-10-29 | Payer: Medicare Other | Source: Other Acute Inpatient Hospital | Admitting: *Deleted

## 2013-10-29 DIAGNOSIS — R0989 Other specified symptoms and signs involving the circulatory and respiratory systems: Secondary | ICD-10-CM | POA: Insufficient documentation

## 2013-10-29 DIAGNOSIS — R079 Chest pain, unspecified: Secondary | ICD-10-CM | POA: Diagnosis present

## 2013-10-29 DIAGNOSIS — R0609 Other forms of dyspnea: Secondary | ICD-10-CM | POA: Insufficient documentation

## 2013-10-29 LAB — CBC WITH DIFFERENTIAL/PLATELET
Basophil #: 0.1 10*3/uL (ref 0.0–0.1)
Basophil %: 0.7 %
EOS PCT: 1.1 %
Eosinophil #: 0.1 10*3/uL (ref 0.0–0.7)
HCT: 23.4 % — ABNORMAL LOW (ref 40.0–52.0)
HGB: 7.3 g/dL — ABNORMAL LOW (ref 13.0–18.0)
LYMPHS ABS: 1 10*3/uL (ref 1.0–3.6)
LYMPHS PCT: 11.4 %
MCH: 29.6 pg (ref 26.0–34.0)
MCHC: 31.3 g/dL — ABNORMAL LOW (ref 32.0–36.0)
MCV: 95 fL (ref 80–100)
Monocyte #: 0.7 x10 3/mm (ref 0.2–1.0)
Monocyte %: 7.5 %
Neutrophil #: 6.9 10*3/uL — ABNORMAL HIGH (ref 1.4–6.5)
Neutrophil %: 79.3 %
PLATELETS: 156 10*3/uL (ref 150–440)
RBC: 2.47 10*6/uL — ABNORMAL LOW (ref 4.40–5.90)
RDW: 16.1 % — ABNORMAL HIGH (ref 11.5–14.5)
WBC: 8.7 10*3/uL (ref 3.8–10.6)

## 2013-10-29 LAB — BASIC METABOLIC PANEL
ANION GAP: 9 (ref 7–16)
BUN: 28 mg/dL — ABNORMAL HIGH (ref 7–18)
CALCIUM: 8.2 mg/dL — AB (ref 8.5–10.1)
CHLORIDE: 98 mmol/L (ref 98–107)
Co2: 32 mmol/L (ref 21–32)
Creatinine: 6.54 mg/dL — ABNORMAL HIGH (ref 0.60–1.30)
EGFR (African American): 9 — ABNORMAL LOW
GFR CALC NON AF AMER: 8 — AB
GLUCOSE: 106 mg/dL — AB (ref 65–99)
OSMOLALITY: 283 (ref 275–301)
POTASSIUM: 4.2 mmol/L (ref 3.5–5.1)
Sodium: 139 mmol/L (ref 136–145)

## 2013-10-29 LAB — GENTAMICIN LEVEL, RANDOM: Gentamicin, Random: 1.3 ug/mL

## 2013-10-29 LAB — TROPONIN I: Troponin-I: 12 ng/mL — ABNORMAL HIGH

## 2013-11-25 ENCOUNTER — Ambulatory Visit: Payer: Self-pay | Admitting: Internal Medicine

## 2013-11-25 DEATH — deceased

## 2013-12-10 ENCOUNTER — Encounter: Payer: Self-pay | Admitting: Internal Medicine

## 2014-07-17 NOTE — Consult Note (Signed)
Impression: 74yo male w/ h/o HTN, ESRD on HD admitted with Enterococcal bacteremia.   Unclear source for his infection.  He has no current GI symptoms and his exam is benign.  His fistula is functioning well without signs of infection. He is currently on vanco and ceftriaxone.   His BCx are growing Ampicillin sensitive Enterococcus.  Will stop the vanco and ceftriaxone.  Change to ampicillin. Will repeat his BCx in am.  If they are negative, we will be able to change to oral therapy to complete 2 weeks. If his BCx remain positive, will continue IV therapy until clear and consider workup for endocarditis.   Electronic Signatures: Kingdavid Leinbach, Heinz Knuckles (MD) (Signed on 13-Jan-14 14:13)  Authored   Last Updated: 13-Jan-14 14:16 by Aspasia Rude, Heinz Knuckles (MD)

## 2014-07-17 NOTE — Consult Note (Signed)
PATIENT NAME:  Micheal Klein, Micheal Klein MR#:  [id number] DATE OF BIRTH:  [birth year]  DATE OF CONSULTATION:  04/08/2012  REFERRING PHYSICIAN:  Dr. Ramonita Lab CONSULTING PHYSICIAN:  Dr. Legrand Como Zohair Epp  REASON FOR CONSULTATION: Enterococcal bacteremia.   HISTORY OF PRESENT ILLNESS: The patient is a 74 year old man with a past history significant for hypertension, end-stage renal disease on hemodialysis, who was admitted on 04/06/2012 with a positive blood culture. The patient states that he has been having 3 or 4 days of fevers, chills, and malaise prior to admission. He had been seen as an outpatient and had blood cultures obtained. These cultures became positive for gram-positive cocci, and he was admitted to the hospital. He has been started on vancomycin and ceftriaxone. He denies any problems with his left forearm fistula. He has had no problems with dialysis and has not had any hypotension or need for dialysis to be cut short. Since being admitted, he has had no fevers, chills, or sweats. He denies any nausea, vomiting. No abdominal pain, no change in his bowels. He makes some urine and has had no dysuria or increased frequency.   ALLERGIES: NSAIDS, STATINS AND ZETIA.   PAST MEDICAL HISTORY: 1. Hypertension.  2. End-stage renal disease, on hemodialysis.  3. Gout.  4. Sleep apnea.  5. GERD.  6. Hypercholesterolemia.  7. Nephrolithiasis.  8. Osteoarthritis.  9. Sleep apnea.  10. Prostate cancer.  11. Status post appendectomy.  12. Status post cataract surgery.  13. Status post hemicolectomy for a benign colon mass.  14. Status post right inguinal repair.   SOCIAL HISTORY: The patient lives at home with his wife. He does not smoke. He does not drink. He has no pets at home. No history of injecting drug use.   FAMILY HISTORY: Positive for lung cancer in a brother and diabetes in a sister.   REVIEW OF SYSTEMS:   GENERAL: Positive fevers, chills, and malaise.  HEENT: Mild headache, no sinus  congestion. No nasal congestion. No sore throat.  NECK: No stiffness. No swollen glands.  RESPIRATORY: No cough, no shortness of breath, no sputum production.  CARDIAC: No chest pains or palpitations.  GASTROINTESTINAL: No nausea, no vomiting, no abdominal pain, no change in his bowels.  GENITOURINARY: No change in his urine.  MUSCULOSKELETAL: He had myalgias and some arthralgias but no frank arthritis.  SKIN: No rashes.   NEUROLOGIC: No focal weakness.  PSYCHIATRIC: No complaints. All other systems are negative.   PHYSICAL EXAMINATION: VITAL SIGNS: T-max 99.0, T-current of 98.2, pulse 73, blood pressure 184/97, 96% on room air.  GENERAL: A 74 year old black man in no acute distress.  HEENT: Normocephalic, atraumatic. Pupils are equal and reactive to light. Sclerae, conjunctivae and lids are without evidence for emboli or petechiae other than a pigmented area in the lateral aspect of the right sclerae that the patient states has been present for years. Oropharynx shows no erythema or exudate. Teeth and gums are in fair condition.  NECK: Supple. Full range of motion. Midline trachea. No lymphadenopathy. No thyromegaly.  LUNGS: Clear to auscultation bilaterally with good air movement. No focal consolidation.  CARDIAC: Regular rate and rhythm without murmur, rub, or gallop.  ABDOMEN: Soft, nontender, and nondistended. No hepatosplenomegaly. No hernia is noted.  EXTREMITIES: No evidence for tenosynovitis.  SKIN: No rashes. No stigmata of endocarditis, specifically, no Janeway lesions nor Osler nodes. He has a fistula in place with a good thrill in the left forearm. There is no surrounding erythema. There is no  drainage from this area. The area was nontender.  NEUROLOGIC: The patient is awake and interactive, moving all 4 extremities.  PSYCHIATRIC: Mood and affect appeared normal.     LABORATORY, DIAGNOSTIC AND RADIOLOGICAL DATA:  BUN 65, creatinine 10.78, bicarbonate 24, anion gap of 13. LFTs  are unremarkable. White count of 7.8 with hemoglobin of 9.1, platelet count of 159, ANC of 5.2. White count on admission was 12.6 with an ANC of 10.4. Blood cultures from January 10th are growing ampicillin-sensitive enterococcus. Blood cultures from admission are growing gram-positive cocci in 3 of 4 bottles. A urinalysis on admission was unremarkable.   A chest x-ray shows no infiltrates.   IMPRESSION: This is a 74 year old man with a past history significant for hypertension, end-stage renal disease on hemodialysis, admitted with enterococcal bacteremia.   RECOMMENDATIONS: 1. Unclear source for this infection. He has no current GI symptoms, and his exam is benign. His fistula is functioning well without signs of infection.  2. He is currently on vancomycin and ceftriaxone.  3. His blood cultures are growing ampicillin-sensitive enterococcus. We will stop the vancomycin and ceftriaxone and change him to ampicillin at a fairly high dose.  4. We will repeat his blood cultures in the morning. If they remain negative, then we will be able to change him to oral therapy to complete 2 weeks from the negative blood cultures.  5. If his blood cultures remain positive, we will continue IV therapy until clear and consider work-up for endocarditis.   This is a moderately complex infectious disease case. Thank you very much for involving me in this patient's care.  ____________________________ Heinz Knuckles. Blinda Turek, MD meb:cb D: 04/08/2012 14:26:16 ET T: 04/08/2012 16:20:27 ET JOB#: [id number]  cc: Heinz Knuckles. Melinna Linarez, MD, <Dictator> Mckynlee Luse E Amazin Pincock MD ELECTRONICALLY SIGNED 04/09/2012 12:02

## 2014-07-17 NOTE — Op Note (Signed)
PATIENT NAME:  Micheal Klein, Micheal Klein MR#:  [id number] DATE OF BIRTH:  10/11/1940  DATE OF PROCEDURE:  11/13/2012  PREOPERATIVE DIAGNOSIS: Urethral stricture.   POSTOPERATIVE DIAGNOSIS: Urethral stricture.    PROCEDURES: 1.  Direct vision internal urethrotomy.  2.  Cystoscopy.   SURGEON: John Giovanni, M.D.   ASSISTANT: None.   ANESTHESIA: General.   INDICATIONS: A 74 year old male with end-stage renal disease on hemodialysis. He had a recent episode of gross hematuria. He has a previous history of prostate cancer, status post radiation therapy. CT of the abdomen and pelvis remarkable for bilateral renal cysts. Cystoscopy in the office was performed, however, urethral stricture was noted. He presents for cystoscopy under anesthesia with possible urethral dilation, stricture/bladder neck incision.   DESCRIPTION OF PROCEDURE: The patient was taken to the operating room and placed in supine position. General anesthetic was administered and he was placed in the low lithotomy position. His external genitalia were prepped and draped in the usual fashion. Timeout was performed per protocol with all in agreement. A 21 French cystoscope was lubricated and passed under direct vision. In the proximal penile urethra a stricture was noted, which scope would not negotiate. The estimated diameter was approximately 10 [place]. The cystoscope was removed. An optical urethrotome with obturator was passed per urethra. The cold knife was placed in the sheath and advanced to the stricture which was incised at the 12 o'clock position. A second, thin stricture was noted just proximal to the stricture which was incised in a similar fashion. A third stricture was noted in the bulbar urethra which again was approximately 10 [place]. Once the stricture was incised the urethrotome was removed. Cystoscope was advanced under direct vision and easily passed into the bladder. There is no bladder neck contracture. Prostatic urethra showed  no lateral lobe enlargement. There was a prominent median lobe present. Bladder mucosa was closely inspected. Ureteral orifices were normal in appearance. There was a mild to moderate trabeculation present. There is no erythema, solid or papillary lesions. Cystoscope was withdrawn. No bleeding was noted at the incision sites. A 20 French [employer] sound was passed without difficulty. An 63 French silicon catheter was placed without difficulty. B and O suppository was placed per rectum. He was taken to the PAC-U in stable condition. There are no complications.   ESTIMATED BLOOD LOSS:  Minimal.     ____________________________ Ronda Fairly. Bernardo Heater, MD scs:dp D: 11/13/2012 12:57:39 ET T: 11/13/2012 14:09:32 ET JOB#: [id number]  cc: Nicki Reaper C. Bernardo Heater, MD, <Dictator> Abbie Sons MD ELECTRONICALLY SIGNED 11/27/2012 11:13

## 2014-07-17 NOTE — Discharge Summary (Signed)
PATIENT NAME:  Micheal Klein, Micheal Klein MR#:  [id number] DATE OF BIRTH:  [birth year]  DATE OF ADMISSION:  05/25/2012 DATE OF DISCHARGE:  05/28/2012  DISCHARGE DIAGNOSES:  1. Enterococcal bacteremia.  2. Chronic renal failure, on hemodialysis.  3. Hypertension.  4. Gout.  5. History of prostate cancer.  6. History of renal stones.  7. Sleep apnea.   HISTORY AND PHYSICAL: Please see dictated admission history and physical.   [place]: The patient was admitted with abrupt onset of fevers, chills. He had blood cultures obtained on arrival which grew out enterococcal faecalis, which was sensitive to ampicillin and Zyvox, similar to the organism which was obtained in blood cultures during his hospitalization in January 2014. At the time, he completed a 2-week course of antibiotic therapy.   His fistula was evaluated by Nephrology and by Infectious Disease, and there was no clear source of infection. He underwent transthoracic echocardiogram, which revealed evidence of impaired relaxation consistent with chronic left-sided diastolic congestive heart failure, with normal right heart function. Small pericardial effusion without tamponade was also noted. There was mild MR, mild TR, mild aortic sclerosis without evidence of vegetation, however, again, this was transthoracic. He was noted to severely increased left ventricular posterior wall thickness, and these were thought to be related more to his hypertension and has a chronic renal failure.   He was placed on vancomycin and Zosyn initially, with Levaquin added as well. Initially there was concern about chest x-ray showing pneumonia, although he had no clinical signs or symptoms of pneumonia. Follow-up chest x-ray was unchanged, and it is suspected that this just revealed some increased interstitial markings. After the findings of enterococcus in the blood cultures, he was taken off Zosyn and Levaquin.   His recommendation from Infectious  Disease is that he complete his total 6-week course of antibiotic therapy. At that time if follow-up blood cultures, which should then be obtained probably every other week at dialysis, are again positive, he will likely need a transesophageal echocardiogram to evaluate for endocarditis as well as consideration for a tagged white cell scan to evaluate his AV fistula to see whether this has been seeded . This was communicated to his nephrologist by Infectious Disease.   He defervesced over the last 24 hours of hospitalization and underwent dialysis without major issues. His blood pressure was noted to be elevated, but it did improve with change of his medication; he tends to do better once he is at home and was improved following dialysis.   At this time, he will be discharged to home in stable condition with his physical activity to be up as tolerated. He will follow up with dialysis on Thursday as is planned, in anticipation of receiving 1 gram vancomycin after dialysis for a total of 6 weeks, with this dose adjusted based on his vancomycin levels. His diet should be a low sodium, renal diet. Recommend that he weigh himself daily, calling for more than 2-pounds gain in 1 day or 5 pounds in 1 week or increasing signs, symptoms of heart failure, of which we really had none during this hospitalization. I anticipate him following up in our office in the next 1 to 2 weeks.   DISCHARGE MEDICATIONS: 1. Colcrys 0.6 mg p.o. daily as needed for gout.  2. Uloric 40 mg p.o. daily as needed for gout.  3. Cardura 8 mg p.o. at bedtime.  4. Pantoprazole 40 mg p.o. b.i.d.  5. Rena-Vite 1 p.o. t.i.d.  6. Renvela 800  mg, 2 tablets p.o. t.i.d. with meals.  7. Toprol-XL 100 mg p.o. b.i.d.  8. Clonidine 0.1 mg p.o. q. a.m.  9. Cozaar 100 mg p.o. daily.  10. Aspirin 81 mg p.o. daily.  11. Vancomycin 1 gram IV after dialysis x 6 weeks. ____________________________ Adin Hector, MD bjk:cb D: 05/28/2012 17:20:00  ET T: 05/28/2012 17:44:34 ET JOB#: [id number] cc: Adin Hector, MD, <Dictator> Cheral Marker. Ola Spurr, MD Trinda Pascal, MD  Ramonita Lab MD ELECTRONICALLY SIGNED 05/28/2012 21:04

## 2014-07-17 NOTE — Discharge Summary (Signed)
PATIENT NAME:  Micheal Klein, Micheal Klein MR#:  [id number] DATE OF BIRTH:  July 02, 1940  DATE OF ADMISSION:  04/06/2012 DATE OF DISCHARGE:  04/10/2012  FINAL DIAGNOSES: 1. Enterococcal bacteremia with systemic inflammatory response syndrome.  2. Chronic renal failure, on hemodialysis.  3. Gout.  4. Obstructive sleep apnea.  5. Accelerated hypertension.  6. Gastroesophageal reflux disease.  7. Hyperlipidemia.   HISTORY AND PHYSICAL: Please see dictated admission history and physical.   [place]: The patient was admitted after having outpatient blood cultures obtained, which were positive. Blood cultures were repeated on arrival here and he placed on vancomycin, Rocephin. Blood cultures subsequently were positive for enterococcus. He was seen by infectious disease, and after his cultures came back with enterococcus and were found to be sensitive to penicillin, he was changed over to IV ampicillin. Blood cultures were repeated, and these remained negative over the next 48 hours, suggesting that the bacteremia had cleared, and reducing likelihood of endocarditis. He underwent echocardiogram, transthoracic, which revealed preserved LV function, without any significant valvular abnormalities.   At this point, it was felt he would be able to go home and finish up his antibiotic course. Recommendation from pharmacy was 500 mg of amoxicillin daily to be equivalent to the ampicillin. He is advised that there is a possibility that his cultures could come back positive, but at 48 hours are negative so this would seem unlikely. If they do return positive, he will need full 6 week course of IV antibiotics and investigation with transesophageal echocardiogram to exclude endocarditis. The etiology for the bacteremia is not clear, although this could be from dialysis access. This was discussed with the patient.  At this time, he will be discharged to home in stable condition with his physical activity up as  tolerated. He will follow a 2 gram sodium renal diet and he will follow up in our office within the next 1 to 2 weeks, at which time we can repeat blood cultures. He otherwise will follow up with dialysis tomorrow as scheduled.   DISCHARGE MEDICATIONS: 1. Colcrys 0.6 mg p.o. daily as needed for gout. 2. Uloric 40 mg p.o. daily for gout.  3. Aspirin 81 mg p.o. daily.  4. Cardura 8 mg p.o. daily.  5. Pantoprazole 40 mg p.o. 2 times a day. 6. Rena-Vite 1 p.o. 3 times a day. 7. Renvela 800 mg two p.o. 3 times daily.  8. Toprol-XL 100 mg p.o. 2 times a day. 9. Clonidine 0.1 mg p.o. daily.  10. Cozaar 25 mg p.o. daily. 11. New medication is amoxicillin 500 mg p.o. daily x 10 days.   Of note, the patient reports that he needs to be on brand name medications. His blood pressure did appear to be improved once we switched him over, and it may be that he responds to one of his blood pressure medications much better in a trade-only form.  ____________________________ Adin Hector, MD bjk:sb D: 04/10/2012 13:09:30 ET T: 04/10/2012 14:48:22 ET JOB#: [id number]  cc: Adin Hector, MD, <Dictator> Ramonita Lab MD ELECTRONICALLY SIGNED 04/22/2012 19:57

## 2014-07-17 NOTE — Consult Note (Signed)
PATIENT NAME:  Micheal Klein, Micheal Klein MR#:  [id number] DATE OF BIRTH:  [birth year]  DATE OF CONSULTATION:  04/07/2012  REFERRING PHYSICIAN:  Wilfred Curtis, MD   CONSULTING PHYSICIAN:  Kemi Gell Lilian Kapur, MD  REASON FOR CONSULTATION: Evaluation and management of end-stage renal disease in a hemodialysis patient.   HISTORY OF PRESENT ILLNESS: The patient is a very pleasant 74 year old African American male with past medical history of prostate cancer, status post brachytherapy, colon cancer status post partial colectomy, hypertension, hyperlipidemia, obstructive sleep apnea, gout, end-stage renal disease on hemodialysis Tuesday, Thursday, and Saturday, followed by [facility], left upper extremity AV fistula placement, who was advised to come to [facility] for a positive blood culture. The patient reports that he has been having fevers and chills as an outpatient. He was to see Dr. Caryl Comes on Monday. He had pre-visit labs performed which demonstrated a positive blood culture from 04/05/2012. This culture  demonstrates gram-positive cocci. The patient was called back to the hospital yesterday for admission. Blood cultures from admission thus far are negative, however. The patient has not had a fever this morning. Upon presentation to the Emergency Department, however, temperature was 100.7.  The patient dialyzes via a left upper extremity AV fistula. There was no obvious drainage from the access or tenderness to the access. It appears that his access is being cannulated with button holes. He states he went to dialysis yesterday and after dialysis was asked to come back to the hospital.   PAST MEDICAL HISTORY: 1. Prostate cancer, status post brachytherapy 04/15/2009.  2. Colon cancer, status post partial colectomy 02/08/2007.  3. Hypertension.  4. Hyperlipidemia.  5. Obstructive sleep apnea.  6. Gout.  7. End-stage renal disease on hemodialysis Tuesday, Thursday, Saturday, followed by [facility]. The patient dialyzes at [facility] on [facility].  8. Anemia of chronic kidney disease.  9. Secondary hyperparathyroidism.  10. Left upper extremity AV fistula placement.  11. Appendectomy.  12. Right inguinal hernia.   ALLERGIES: ADVIL, CRESTOR, IBUPROFEN, LIPITOR, PRAVACHOL, ZETIA.   CURRENT INPATIENT MEDICATIONS: Aspirin 81 mg daily, clonidine 0.1 mg p.o. every a.m., doxazosin 8 mg p.o. at bedtime, heparin 5000 units subcutaneous every 12 hours, metoprolol 100 mg p.o. b.i.d., multivitamin 1 tablet p.o. daily, Uloric 40 mg p.o. daily, Protonix 40 mg p.o. b.i.d., ceftriaxone 1 gram IV every 24 hours, Renvela 1.6 grams p.o. t.i.d., vancomycin 1000 mg IV x 1 today.   SOCIAL HISTORY: The patient lives in [place]. He is married. He denies tobacco, alcohol, or illicit drug use.   FAMILY HISTORY: The patient states that his mother and father are both deceased. He is unclear as to how they died. No apparent family history of end-stage renal disease.   REVIEW OF SYSTEMS:  CONSTITUTIONAL: The patient has been having fevers and chills at home.  EYES: Denies diplopia or blurry vision.  HEENT: Denies headaches, hearing loss. Denies epistaxis.  CARDIOVASCULAR: Denies chest pain, palpitations, PND.  RESPIRATORY: Denies cough, shortness of breath or hemoptysis.  GASTROINTESTINAL: Denies nausea, vomiting, diarrhea or bloody stools.  GENITOURINARY: Denies frequency, urgency or dysuria. Does have history of end-stage renal disease.  MUSCULOSKELETAL: Denies joint pain, swelling, redness.  INTEGUMENTARY: Denies skin rashes or lesions.  NEUROLOGIC: Denies focal extremity numbness, weakness or tingling.  PSYCHIATRIC: Denies depression, bipolar disorder.  ENDOCRINE: Denies polyuria, polydipsia or polyphagia.  HEMATOLOGIC/LYMPHATIC: Denies easy bruisability, bleeding, or swollen lymph nodes. ALLERGY/IMMUNOLOGIC: Denies seasonal allergies or history of immunodeficiency.    LABORATORY DATA:  BMP sodium 138, potassium 3.8, chloride 99, CO2 30, BUN 37, creatinine 7.5, glucose 98, total protein 8.1, albumin 3.4, total bilirubin 0.7, alkaline phosphatase 76, AST 28, ALT 21. CBC shows WBC 12.6, hemoglobin 10.2, hematocrit 31, platelets 161. A blood culture from 04/05/2012 shows gram-positive cocci. Blood culture x 2 sets on January 11th are thus far negative. Urinalysis shows urine protein 100 mg/dL, 1 RBC per high-power field, 2 WBCs per high-power field.  PHYSICAL EXAMINATION: VITAL SIGNS: Temperature 99.9, pulse 92, respirations 16, blood pressure 144/70.  GENERAL: A well-developed, well-nourished [place] American male who appears his stated age, currently in no acute distress.  HEENT: Normocephalic, atraumatic. Extraocular movements are intact. Pupils are equal, round, and reactive to light. No scleral icterus. Conjunctivae are pink. No epistaxis noted. Gross hearing intact. Oral mucosa moist.  NECK: Supple without JVD or lymphadenopathy.  LUNGS: Clear to auscultation bilaterally with normal respiratory effort.  HEART: S1, S2, regular rate and rhythm. No audible murmurs or rubs.  ABDOMEN: Obese, soft, nontender, nondistended. Bowel sounds are positive. No rebound, no guarding. No gross organomegaly appreciated.  EXTREMITIES: No clubbing, cyanosis, or edema.  NEUROLOGIC: The patient is alert and oriented to time, person, and place. Strength is 5 out of 5 in both upper and lower extremities.  SKIN: Warm and dry. No rashes noted.  MUSCULOSKELETAL: No joint redness, swelling or tenderness appreciated.  GENITOURINARY: No suprapubic tenderness is noted at this time.  PSYCHIATRIC: The patient has an appropriate affect and appears to have good insight into his current illness.  HEMODIALYSIS ACCESS: The patient has a left upper extremity AV fistula in place. There was no tenderness overlying the fistula, no erythema of the fistula. The patient has button holes in place. No pus expressed from the  buttonhole tracts.   IMPRESSION/PLAN: This is a 74 year old African American male with past medical history of hypertension, hyperlipidemia, obstructive sleep apnea, gout, end-stage renal disease on hemodialysis, left upper extremity arteriovenous fistula, prostate cancer status, post brachytherapy 09/13/2009, colon cancer, status post partial colectomy 02/08/2007, who presented to [facility] with fevers and a positive blood culture.   1. End-stage renal disease: On hemodialysis Tuesday, Thursday, and Saturday, followed by [facility] at [facility] on [facility]. The patient had his normally scheduled dialysis yesterday. There is no acute indication for dialysis today. We will plan for dialysis again on Tuesday if the patient is still here.  2. Bacteremia with fevers and chills: The patient had gram-positive cocci in the blood noticed on a blood culture on 04/05/2012 which is preadmission. No obvious source of infection at present; however, it is possible that skin bacteria could have been introduced during dialysis. The patient is currently on vancomycin, which we would continue at this point in time. Continue to follow the two blood cultures that were drawn on 04/06/2012. If no source is found, I would consider infectious disease consultation as well.  3. Anemia of chronic kidney disease: Hemoglobin is noted as being 10.2. We will need to call his outpatient center to determine his outpatient Epogen dosing.  4. Secondary hyperparathyroidism: We will check intact PTH and phosphorus during the next dialysis. The patient has been started on Renvela powder while here. Normally, he takes Renvela tablets at home. We will switch him back to Renvela tablets.  5. I would like to thank Dr. Lenore Manner for this kind referral. Further plans as the patient progresses.  ____________________________ Tama High, MD mnl:cb D: 04/07/2012 13:24:31 ET T: 04/07/2012 13:43:51  ET JOB#: [id number]  cc: Tama High, MD, <Dictator> Mariah Milling Makalah Asberry MD ELECTRONICALLY SIGNED 05/03/2012 22:50

## 2014-07-17 NOTE — H&P (Signed)
PATIENT NAME:  Micheal Klein, Micheal Klein MR#:  [id number] DATE OF BIRTH:  02-Feb-1941  DATE OF ADMISSION:  05/25/2012  PRIMARY CARE PHYSICIAN:  Dr. Ramonita Lab   REFERRING PHYSICIAN:  Dr. Benjaman Lobe   NEPHROLOGIST:  Dr. Murlean Iba   CHIEF COMPLAINT:  Weakness, fever with chills.   HISTORY OF PRESENT ILLNESS:  The patient is a 74 year old African American male with a past medical history of end-stage renal disease on hemodialysis on Tuesday, Thursday and Saturday, hypertension, GERD, obstructive sleep apnea, hyperlipidemia, osteoarthritis and gout. Was recently admitted to the hospital on 04/06/2012 for positive blood cultures and he was discharged home with a final diagnosis of enterococcal bacteremia with systemic inflammatory response syndrome and was treated at the time with vancomycin and Rocephin initially, and as the cultures were sensitive to penicillin he was eventually changed to IV ampicillin and discharged home with amoxicillin.  The blood cultures, which were collected at that time, were negative, both aerobically and anaerobically, and the etiology of the bacteremia was not clear at that time.  During this admission, blood cultures were obtained in the ER and patient has received IV Rocephin, Levaquin and vancomycin.  The patient is resting comfortably during my examination.  Denies any cough, congestion or body aches.  Denies any nausea, vomiting, abdominal pain either.  Denies any chest pain, palpitations or racing of the heart.  The patient had echocardiogram done during the last visit and ejection fraction was 55% with mild tricuspid regurgitation.  They thought of treating him with a 6 week course of IV antibiotics and investigation with transesophageal echocardiogram to exclude endocarditis if the blood cultures returned positive.  The blood cultures eventually turned out to be negative and he was discharged home with amoxicillin 500 mg and the patient is reporting that he has finished the  antibiotic course as reported by the physician.  Under his home medication list, amoxicillin is still there and I am not quite sure why patient is still taking amoxicillin.   PAST MEDICAL HISTORY:  End-stage renal disease on hemodialysis, hypertension, hyperlipidemia, GERD, gout, obstructive sleep apnea, osteoarthritis, history of prostate cancer, kidney stones.   PAST SURGICAL HISTORY:  Appendectomy, cataract surgery, right inguinal repair, hemicolectomy for benign colon mass.    ALLERGIES:  The patient is allergic to ADVIL, CRESTOR, IBUPROFEN, LIPITOR, PRAVACHOL, ZETIA.   HOME MEDICATIONS:  Uloric 40 mg once a day, Toprol-XL 100 mg 2 times a day, Renvela 800 mg 2 tablets 3 times a day, Rena-Vite 1 tablet 3 times a day, Protonix 40 mg 2 times a day, Cozaar 25 mg once a day, Colcrys 0.6 mg 1 tablet once a day, clonidine 0.1 mg once daily, Cardura 8 mg once a day, aspirin 81 mg once daily, amoxicillin 500 mg once daily.   PSYCHOSOCIAL HISTORY:  Lives at home with wife.  He is retired from work.  Denies smoking, alcohol or illicit drug usage.   FAMILY HISTORY:  His brother died from lung cancer with mets to brain.   REVIEW OF SYSTEMS:  CONSTITUTIONAL:  Complaining of fever.  Denies fatigue. Positive weakness.  Denies any weight loss, weight gain or chest pain.  EYES:  Denies any blurry vision, glaucoma, cataracts.  EARS, NOSE, THROAT:  No tinnitus, ear pain, discharge.  No postnasal drip, sinus pain. RESPIRATION:  Denies any cough, COPD, hemoptysis.  Positive obstructive sleep apnea.  CARDIOVASCULAR:  Denies chest pain, palpitations, syncope.  GASTROINTESTINAL:  Denies nausea, vomiting, diarrhea, abdominal pain.  Positive GERD.  GENITOURINARY:  No dysuria, hematuria.  ENDOCRINE:  No polyuria, nocturia, thyroid problems.  HEMATOLOGIC AND LYMPHATIC:  Chronic anemia is present.  Denies easy bruising, bleeding, swollen glands.  INTEGUMENTARY:  No acne, rash, lesions.  MUSCULOSKELETAL:  No joint  pain in the neck, back, shoulder or knees.   NEUROLOGIC:  Denies vertigo, ataxia.  No history of seizures.  PSYCHIATRIC:  No insomnia, ADD, OCD.   PHYSICAL EXAMINATION: VITAL SIGNS:  Temperature 99.1; initially it was at 102 degrees Fahrenheit. Pulse 78, respirations 20, blood pressure 139/96, sating 96% on room air.  GENERAL APPEARANCE:  Not under acute distress, moderately built and moderately nourished.  HEENT:  Normocephalic, atraumatic.  Pupils are equally reacting to light and accommodation.  No scleral icterus.  No conjunctival injection.  Extraocular movements are intact.  No nasal discharge or congestion.  No sinus tenderness.  Tympanic membranes are intact.  NECK:  Supple.  No JVD, no thyromegaly.  LUNGS:  Positive rales and crackles.  No accessory muscle usage.  No anterior chest wall tenderness on palpation.  CARDIOVASCULAR:  S1, S2 normal.  Regular rate and rhythm.  Positive murmur; not quite sure whether it is a new cardiac murmur versus old.  Trace peripheral edema.  GASTROINTESTINAL:  Soft.  Bowel sounds are positive in all four quadrants.  Nontender, nondistended.  No masses felt.   NEUROLOGIC:  Awake, alert, oriented x 3.  Cranial nerves II through XII are grossly intact.  Reflexes are 2+.  Motor and sensory are grossly intact.  MUSCULOSKELETAL:  No joint effusion, tenderness or erythema noted.  Range of motion is grossly intact.  SKIN:  With no lesions.  No masses.  No rashes.  Left forearm with intact AV fistula.  No tenderness, good bruit.  No erythema noticed.  No signs of infection noticed.  PSYCHIATRIC:  Normal mood and affect.   BACK:  No CVA tenderness.   LABORATORY AND IMAGING STUDIES:  A 12-lead EKG showing sinus rhythm, left atrial enlargement, left axis deviation.  Chest x-ray has revealed bilateral diffuse interstitial thickness likely representing interstitial edema versus interstitial pneumonitis secondary to an infectious or inflammatory etiology.  Glucose is 117,  BUN 39, creatinine 9.20, sodium 140, potassium 4.0, chloride 103, CO2 29, GFR 6, anion gap 8, serum osmolality 290, calcium 8.2.  LFTs are within normal range.  WBC 9.5, hemoglobin 10.1, hematocrit 30.6, platelet count 160, MCV 97.  Urinalysis: Yellow in color, hazy in appearance.  Glucose, bilirubin, ketones are negative.  Specific gravity 1.013, pH 7.0, leukocyte esterase trace, nitrate negative.  Recent echocardiogram was done on January 14th, has revealed 55% ejection fraction.   ASSESSMENT AND PLAN:  A 74 year old African American male presenting with chills, weakness and fever of 102. Will be admitted with the following assessment and plan:  1.  Sepsis, probably from developing pneumonia, which is healthcare-associated, and urinary tract infection.  Pan cultures were obtained.  We will give him IV Zosyn, Levaquin and vancomycin.  We will follow up on the cultures.  The patient does not have symptoms and signs of flu, but as he is coming with high grade fever a stat flu test is ordered, which is pending.  Other differentials: AV fistula infection, but it does not seem to be infected at this time, not erythematous, nontender.  During the previous admission, there was a concern about endocarditis, but blood cultures turned out to be negative.  The patient has cardiac murmur during this admission and I am not quite sure whether this is  new or old.  If the source of infection is not clear, one should consider ruling out endocarditis and this was discussed with Dr. Juleen China.  He is aware.  2.  End-stage renal disease.  Consult nephrology for hemodialysis. Notified to Dr. Juleen China by myself.  3.  Obstructive sleep apnea.  We will provide him CPAP at bedtime.  4.  Hypertension.  We will resume home medications and monitor blood pressure closely.  5.  Gastroesophageal reflux disease.  Continue proton pump inhibitor.  6.  Hyperlipidemia, stable.  We will continue statin.  7.  Deep vein thrombosis prophylaxis with  heparin subQ.  8.  CODE STATUS:  THE PATIENT IS FULL CODE.   We will transfer the patient to [place] in the a.m.   TOTAL TIME SPENT ON ADMISSION:  50 minutes.   The diagnosis and plan of care was discussed in detail with the patient and his wife at bedside.  They both verbalized understanding of the plan.     ____________________________ Nicholes Mango, MD ag:ea D: 05/25/2012 23:47:43 ET T: 05/26/2012 00:31:50 ET JOB#: [id number]  cc: Nicholes Mango, MD, <Dictator> Nicholes Mango MD ELECTRONICALLY SIGNED 05/29/2012 1:32

## 2014-07-17 NOTE — H&P (Signed)
PATIENT NAME:  Micheal Klein, Micheal Klein MR#:  [id number] DATE OF BIRTH:  25-May-1940  DATE OF ADMISSION:  04/06/2012  PRIMARY CARE PHYSICIAN: Dr. Ernst Spell.   REFERRING PHYSICIAN: Dr. Conni Slipper.   CHIEF COMPLAINT: Positive blood culture.   HISTORY OF PRESENT ILLNESS: The patient is a 74 year old African-American male with a history of end-stage renal disease on hemodialysis on Tuesday, Thursday, and Saturday.  History of systemic hypertension. The patient was seen at [facility] yesterday with complaint of fever and chills for last 3 to 4 days without a focus of infection. He had one blood culture taken and the patient was discharged home.  He was called today to come to the hospital to be admitted since the blood culture was growing gram-positive cocci. The patient reports no new symptoms and he denies having any cough. No chest pain, no vomiting, no diarrhea, no abdominal pain, no urinary symptoms. No abscess formation anywhere and the AV fistula site looks clean. The patient was admitted for further evaluation and treatment.   REVIEW OF SYSTEMS: CONSTITUTIONAL: The patient reports fever and chills over last 3 to 4 days. No significant fatigue. No sweating.  EYES: No blurring of vision. No double vision.  ENT: No hearing impairment. No sore throat. No dysphagia.  CARDIOVASCULAR: No chest pain. No shortness of breath. No syncope.  RESPIRATORY: No cough. No sputum for the production. No chest pain. No hemoptysis.  GASTROINTESTINAL: No abdominal pain, no vomiting, no diarrhea.  GENITOURINARY: No dysuria. No frequency of urination.  MUSCULOSKELETAL: No joint pain or swelling. No muscular pain or swelling.  INTEGUMENTARY: No skin rash. No ulcers.  NEUROLOGY: No focal weakness. No seizure activity. No headache.  PSYCHIATRY: No anxiety. No depression.  ENDOCRINE: No polyuria or polydipsia. No heat or cold intolerance.   HEMATOLOGY: No easy bruisability. No lymph node enlargement.   PAST MEDICAL  HISTORY: End-stage renal disease on hemodialysis on Tuesday, Thursday, and Saturday. This is ongoing for the last 3 to 4 years. Systemic hypertension, obstructive sleep apnea, gout, arthropathy, gastroesophageal reflux disease, hyperlipidemia, kidney stones, osteoarthritis, obstructive sleep apnea. Also, again, history of prostatic cancer.   PAST SURGICAL HISTORY: Appendectomy, cataract surgery, right inguinal repair, hemicolectomy for benign colon mass.  SOCIAL HABITS: Nonsmoker. No history of alcohol abuse.   SOCIAL HISTORY: He is married, living with his wife.  He retired from working at [employer].   FAMILY HISTORY: Unremarkable, except for his brother who died from lung cancer with metastasis to the brain.  ADMISSION MEDICATIONS:  Uloric 40 mg once a day, Toprol-XL 100 mg twice a day, Renvela 800 mg taking 2 tablets 3 times a day. Rena-Vite 1 tablet 3 times a day, Protonix 40 mg twice a day, Colcrys 0.6 mg once a day p.r.n. for gout. Clonidine 0.1 mg once a day, Cardura 8 mg once a day, aspirin 81 mg a day.   ALLERGIES: IBUPROFEN AND ADVIL CAUSING GI DISTRESS. PRAVACHOL, ZETIA  AND LIPITOR. ALSO CRESTOR AS WELL.   PHYSICAL EXAMINATION: VITAL SIGNS: Blood pressure 145/73, respiratory rate 18, pulse 93, temperature 100.7. Pulse oximetry 94%.  GENERAL APPEARANCE: Elderly male lying in bed in no acute distress.  HEAD AND NECK: No pallor. No icterus. No cyanosis.  ENT: Ear examination revealed normal hearing. No discharge. No lesions. Nasal mucosa was normal without discharge. No bleeding. No ulcers. Oropharyngeal area was normal without oral thrush, no exudates, no ulcers. Eye examination revealed normal eyelids and conjunctivae. Pupils about 4 mm, round, equal, sluggishly reactive  to light. There is arcus stimulus.  NECK: Supple. Trachea at midline. No thyromegaly. No cervical lymphadenopathy. No masses. CARDIOVASCULAR: Normal S1, S2. No S3 or S4. No murmur. No gallop. No carotid  bruits.  LUNGS: Normal breathing pattern without use of accessory muscles. No rales. No wheezing.  ABDOMEN: Soft without tenderness. No hepatosplenomegaly. No masses. No hernias.   SKIN: No ulcers. No subcutaneous nodules. AV fistula site on the left arm looks clean and no redness. No tenderness. No discharge.  MUSCULOSKELETAL: No joint swelling. No clubbing.  NEUROLOGIC: Cranial nerves II through XII are intact. No focal motor deficit. Sense of touch is preserved.  PSYCHIATRIC: The patient is alert and oriented x 3. Mood and affect were normal.   LABORATORY FINDINGS AND RADIOLOGIC FINIDNGS: Chest x-ray showed cardiomegaly. No acute cardiopulmonary abnormalities.  Again, one blood culture showed gram-positive cocci. The sensitivity is pending. Serum glucose 98, BUN 37, creatinine 7.5, sodium 138, potassium 3.8, calcium 8.3, albumin 3.4. Normal liver transaminases. CBC showed white count 12,600, hemoglobin was 10.2 with hematocrit of 31, platelet count 161,000. A urinalysis was unremarkable with 1 red blood cell and 2 white blood cells.   ASSESSMENT: 1.  Fever and chills times 3 to 4 days and the focus of infection is unclear.   2.  One blood culture that grew gram-positive cocci, awaiting final results.   3.  End-stage renal disease on hemodialysis.  4.  Systemic hypertension.  5.  Obstructive sleep apnea.  6.  Hyperlipidemia.  7.  His other medical problems are gout, kidney stones, gastroesophageal reflux disease, hyperlipidemia.   PLAN: We will admit the patient to the medical floor. Two sets of blood cultures taken. Empiric IV antibiotic using Rocephin and vancomycin were started, pending results of the cultures. Continue home medications as listed above. CPAP treatment at night for his sleep apnea. For deep vein thrombosis prophylaxis, I placed the patient on subcutaneous heparin 5000 units twice a day. Consult nephrology. The patient already had his dialysis on Saturday. This is in  preparation for the next dialysis should he stayed here for several days. The patient indicated that he has no living will and that his code status is full code.   Time spent evaluating this patient and reviewing medical records took more than 55 minutes.     ____________________________ Clovis Pu. Lenore Manner, MD amd:cc D: 04/06/2012 23:31:00 ET T: 04/07/2012 22:46:51 ET JOB#: [id number]  cc: Clovis Pu. Lenore Manner, MD, <Dictator> Ellin Saba MD ELECTRONICALLY SIGNED 04/09/2012 7:18

## 2014-07-17 NOTE — Consult Note (Signed)
PATIENT NAME:  Micheal Klein, Micheal Klein MR#:  [id number] DATE OF BIRTH:  06/21/1940  DATE OF CONSULTATION:  05/27/2012  REFERRING PHYSICIAN:  Ramonita Lab, MD CONSULTING PHYSICIAN:  Cheral Marker. Ola Spurr, MD  REASON FOR CONSULTATION: Fever and bacteremia in an end-stage renal disease patient.   HISTORY OF PRESENT ILLNESS: Micheal Klein is a very pleasant 74 year old gentleman with known end-stage renal disease, on hemodialysis through a left AV fistula that is buttonholed. He was admitted by the hospitalist on March 1st with episodes of fevers, chills. He had had no other symptoms. His hemodialysis fistula had been working well. Of note, he had a recent admission in January of 2014 for enterococcal bacteremia and sepsis. He was treated at that time with vancomycin, but then transitioned to ampicillin once enterococcus was grown. He did not complete a prolonged IV course, but instead was discharged on amoxicillin. He has been off of this now for several weeks. At that time, he did have an echocardiogram but since his blood cultures cleared he was not worked up further for endocarditis or treated aggressively.   Since admission the patient has continued to have fevers. Two of 2 blood cultures are growing gram-positive cocci in pairs and chains. Other than the chills and fevers he has had, he has had no cough, headaches, shortness of breath, skin rashes, abdominal pain, nausea, vomiting, diarrhea or dysuria.   PAST MEDICAL HISTORY: 1.  End-stage renal disease on hemodialysis.  2.  Hypertension.  3.  Hyperlipidemia.  4.  GERD.  5.  Gout.  6.  Obstructive sleep apnea.   7.  Osteoarthritis.  8.  Prostate cancer.  9.  Kidney stones.  10.  Recent enterococcal bacteremia.   PAST SURGICAL HISTORY:  1.  Appendectomy.  2.  Cataract surgery.  3.  Right inguinal repair. 4.  Hemicolectomy for benign colon mass.  5.  Left AV fistula creation.   ALLERGIES:  ADVIL, CRESTOR, IBUPROFEN, LIPITOR, PRAVACHOL AND  ZETIA.  CURRENT MEDICATIONS: Since admission the patient has been on vancomycin, Zosyn and levofloxacin for presumed healthcare-associated pneumonia. Other medications include: 1.  Clonidine. 2.  Doxazosin. 3.  Losartan. 4.  Metoprolol. 5.  Multivitamin. 6.  Pantoprazole. 7.  Sevelamer carbonate. 8.  Aspirin. 9.  Sub-Q heparin. 10.  Epogen. 11.  Tylenol. 12.  Zofran.  REVIEW OF SYSTEMS:  Eleven systems reviewed and negative, except as per HPI.   SOCIAL HISTORY: The patient is married and lives with his wife. He is retired. Does not smoke, drink or use drugs.   FAMILY HISTORY: Positive for lung cancer with mets to the brain, in his brother.  PHYSICAL EXAMINATION: VITAL SIGNS: Since admission, the patient's T-max has been 102.2. Over the last 24 hours his T-max has been 101.8. Blood pressure is 158/87, pulse 78, respirations 20 and sat of 93% on room air.  GENERAL: He is a pleasant, interactive, obese gentleman sitting up eating a meal, in no acute distress.  HEENT: Pupils are equal, round and reactive to light and accommodation. Extraocular movements are intact. Sclera anicteric. Oropharynx clear.  NECK: Supple. He has no anterior cervical, posterior cervical or supraclavicular lymphadenopathy.  HEART: Regular with no murmur.  LUNGS: Clear to auscultation bilaterally.  ABDOMEN: Soft, nontender and nondistended. No hepatosplenomegaly.  EXTREMITIES: No clubbing, cyanosis or edema.  VENOUS ACCESS:  He has a left AV fistula, which has a good thrill. No tenderness, erythema or warmth. There is a buttonhole area that appears normal with no purulence or drainage.  SKIN:  There is no stigmata of endocarditis.  NEUROLOGIC: He is alert and oriented x 3. Grossly nonfocal neuro exam.   DATA: Blood culture on admission is growing 2 out of 2 gram-positive cocci in pairs and chains. Followup on blood cultures from yesterday are negative. Basic panel is consistent with end-stage renal disease. LFTs  are normal. White count 9.4, hemoglobin 9.3 and platelets 147. Influenza was negative. Urine culture is mixed bacteria. Urinalysis is less than 1 red, only 3 white cells.   Chest x-ray revealed bilateral diffuse interstitial thickening likely representing interstitial edema versus interstitial pneumonitis. Echocardiogram is pending.   IMPRESSION: A 74 year old with end-stage renal disease admitted for second episode of bacteremia and fever. He does not have any lines or catheters in place. This pathogen will likely be the enterococcal infection he had last time. There is no obvious source of the infection as it would usually be a gastroenterology or genitourinary bacteria. I suspect he became infected earlier in the year as he did his AV fistula or possibly has endocarditis. I think he has not had adequate trial of antibiotic therapy at this point. We are checking a TTE. At this point, as he is not septic, has no murmur, no other peripheral stigmata of endocarditis, I would hold off on a TEE. Even if he has endocarditis, the initial treatment recommendation would be 6 weeks of IV antibiotic to try to clear this infection. I have spoken with the renal doctor about this as well.   RECOMMENDATIONS: 1.  Await final culture results.  2.  Repeat cultures are negative.  3.  Continue vancomycin likely as an outpatient for 6 week total therapy. He can get this at dialysis with a goal trough of 15 to 20.  4.  Following cessation of the antibiotics, we would recommend repeating blood cultures at every other hemodialysis session. If cultures end up positive again, he would need to be readmitted and get worked up for possible arteriovenous fistula infection or endocarditis. At that point, I would recommend a tagged white blood cell scan to see if the fistula lights up. I also recommend TEE. However, I would suggest holding off on this until we complete a 6-week course of intravenous vancomycin.  5.  I discussed this  case with renal as well as with Dr. Caryl Comes and with the patient.   Thank you very much for the consult. I will be glad to follow along with you.  ____________________________ Cheral Marker. Ola Spurr, MD dpf:sb D: 05/27/2012 13:57:08 ET T: 05/27/2012 14:39:04 ET JOB#: [id number]  cc: Cheral Marker. Ola Spurr, MD, <Dictator> Lekendrick Ola Spurr MD ELECTRONICALLY SIGNED 05/29/2012 19:27

## 2014-07-18 NOTE — Consult Note (Signed)
Chief Complaint:  Subjective/Chief Complaint Patient feels better today status post TEE. no chest pain no shortness of breath . feels better to date any digestive. reduce shortness of breath since dialysis   VITAL SIGNS/ANCILLARY NOTES: **Vital Signs.:   22-Jul-15 12:14  Vital Signs Type Routine  Temperature Temperature (F) 97.8  Celsius 36.5  Temperature Source oral  Pulse Pulse 85  Respirations Respirations 18  Systolic BP Systolic BP 546  Diastolic BP (mmHg) Diastolic BP (mmHg) 64  Mean BP 81  Pulse Ox % Pulse Ox % 95  Pulse Ox Activity Level  At rest  Oxygen Delivery 2L  *Intake and Output.:   Shift 22-Jul-15 15:00  Grand Totals Intake:  240 Output:      Net:  240 24 Hr.:  240  Oral Intake      In:  240  Length of Stay Totals Intake:  1960 Output:  5035    Net:  -4505   Brief Assessment:  GEN well developed, well nourished, no acute distress   Cardiac Regular  murmur present  + LE edema   Respiratory normal resp effort  clear BS   Gastrointestinal Normal   Gastrointestinal details normal Soft  Nontender  Nondistended   EXTR negative cyanosis/clubbing, positive edema   Lab Results: LabObservation:  22-Jul-15 07:52   OBSERVATION Reason for Test  Lab:  22-Jul-15 04:50   O2 Saturation (Pulse Ox) 92  FiO2 (Pulse Ox) 0.21 (Result(s) reported on 15 Oct 2013 at 05:09AM.)   Radiology Results: XRay:    19-Jul-15 10:20, Chest Portable Single View  Chest Portable Single View   REASON FOR EXAM:    SHORT OF BREATH  COMMENTS:       PROCEDURE: DXR - DXR PORTABLE CHEST SINGLE VIEW  - Oct 12 2013 10:20AM     CLINICAL DATA:  Short of breath.    EXAM:  PORTABLE CHEST - 1 VIEW    COMPARISON:  10/07/2013.    FINDINGS:  Cardiac silhouette is mildly enlarged.  The mediastinal masses.  Bilateral irregular interstitial thickening. There are hazy areas of  airspace opacity. Small bilateral effusions.    Bony thorax is intact.     IMPRESSION:  Worsened congestive  heart failure when compared to the prior study.      Electronically Signed    By: Lajean Manes M.D.    On: 10/12/2013 10:24         Verified By: Lasandra Beech, M.D.,  Cardiology:    19-Jul-15 10:05, ECG  Ventricular Rate 118  Atrial Rate 118  P-R Interval 164  QRS Duration 112  QT 322  QTc 451  P Axis 60  R Axis -50  T Axis 79  ECG interpretation   *** Poor data quality, interpretation may be adversely affected  Sinus tachycardia  Left axis deviation  Incomplete right bundle branch block  Septal infarct (cited on or before 13-Aug-2013)  Abnormal ECG  When compared with ECG of 05-Oct-2013 10:12,  ST now depressed in Lateral leads  ----------unconfirmed----------  Confirmed by OVERREAD, NOT ([id number]), editor PEARSON, BARBARA ([id number]) on 10/13/2013 10:25:41 AM  ECG     20-Jul-15 18:15, ECG  Ventricular Rate 147  Atrial Rate 138  QRS Duration 112  QT 292  QTc 456  R Axis -58  T Axis 103  ECG interpretation   Atrial fibrillation with rapid ventricular response  Left axis deviation  Incomplete right bundle branch block  Anteroseptal infarct (cited on or before 13-Aug-2013)  Abnormal ECG  When compared with ECG of 12-Oct-2013 10:05,  Atrial fibrillation hasreplaced Sinus rhythm  Questionable change in initial forces of Anterior leads  ST no longer depressed in Lateral leads  Confirmed by Grayland Jack ([id number]) on 10/14/2013 8:32:56 AM    Overreader: Grayland Jack  ECG    Assessment/Plan:  Assessment/Plan:  Assessment IMP 1congestive heart failure 2 hypertension 3 end-stage renal disease 4 coronary disease 5 hyperlipidemia 6 status post myocardial infarction 7 sepsis 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   Plan PLAN  s/p  TEE  now with aortic valve vegetation 2  continue hypertension control with current medications 3  continue dialysis as necessary 4 broad & antibiotic coverage for endocarditis 5 recommend cardiac rehab once patient is more  stable 6 continue statin therapy   Electronic Signatures: Yolonda Kida (MD)  (Signed 23-Jul-15 16:30)  Authored: Chief Complaint, VITAL SIGNS/ANCILLARY NOTES, Brief Assessment, Lab Results, Radiology Results, Assessment/Plan   Last Updated: 23-Jul-15 16:30 by Lujean Amel D (MD)

## 2014-07-18 NOTE — H&P (Signed)
PATIENT NAME:  Micheal Klein, Micheal Klein MR#:  [id number] DATE OF BIRTH:  [birth year]  DATE OF ADMISSION:  10/12/2013  PRIMARY CARE PHYSICIAN: Dr. Ramonita Lab.   CHIEF COMPLAINT: Shortness of breath.   HISTORY OF PRESENT ILLNESS: This is a 74 year old male who presents to the Emergency Room complaining of shortness of breath that began earlier this morning. The patient was just recently discharged from the hospital two days ago and comes back in to the hospital with the above complaint. The patient is a hemodialysis patient, was supposed to get dialyzed yesterday but missed his hemodialysis as he was feeling weak. The patient presented to the hospital  noted to be in acute hypoxic respiratory failure with oxygen saturation in the low 80s to mid 80s on nonrebreather. The patient's chest x-ray findings are consistent with volume overload and congestive heart failure. Hospitalist services were contacted for further treatment and evaluation. The patient denies any chest pain, any nausea, vomiting, abdominal pain, any fevers, chills, cough or any other associated symptoms presently.   REVIEW OF SYSTEMS: CONSTITUTIONAL: No documented fever. No weight gain or weight loss.  EYES: No blurry or double vision.  ENT: No tinnitus. No postnasal drip. No redness of the oropharynx.  RESPIRATORY: No cough, no wheeze, no hemoptysis. Positive dyspnea.  CARDIOVASCULAR: No chest pain. No orthopnea. No palpitations, no syncope.  GASTROINTESTINAL: No nausea, no vomiting, no diarrhea, no abdominal pain. No melena or hematochezia.  GENITOURINARY: No dysuria and hematuria.  ENDOCRINE: No polyuria or nocturia. No heat or cold intolerance.  HEMATOLOGIC: No anemia, no bruising, no bleeding.  INTEGUMENTARY: No rashes. No lesions.  MUSCULOSKELETAL: No arthritis. No swelling. No gout.  NEUROLOGIC: No numbness or tingling. No ataxia. No seizure-type activity. PSYCHIATRIC:  No anxiety. No insomnia. No ADD.   PAST MEDICAL HISTORY:  Consistent with end-stage renal disease on hemodialysis, Tuesday, Thursday, Saturday. GERD, hypertension, hyperlipidemia, history of coronary artery disease, status post recent myocardial infarction, history of prostate cancer.   ALLERGIES: MULTIPLE DRUGS INCLUDING CRESTOR, ADVIL, IBUPROFEN LEVAQUIN, LIPITOR, PRAVACHOL AND SHELLFISH.   SOCIAL HISTORY: Used to be a smoker, and smoked for about 10 years, quit about 15 to 20 years ago. Also has a history of alcohol abuse but quit many years ago. No illicit drug abuse. Lives at home with his wife.   FAMILY HISTORY: Mother and father are both deceased. Father had prostate cancer. Hypertension, also runs in the family and diabetes.   CURRENT MEDICATIONS: As follows: Protonix 40 mg b.i.d., Cozaar 100 mg daily, Renvela 100 mg 3 tabs t.i.d., multivitamin daily, sublingual nitroglycerin as needed, oxycodone 5 mg q.6 hours as needed, vitamin D3 1000 units daily, metoprolol succinate 100 mg b.i.d., amlodipine 10 mg daily, aspirin 325 mg daily, vancomycin at dialysis for four weeks.   PHYSICAL EXAMINATION: Presently is as follows:  VITAL SIGNS: Noted to be temperature 97.5, pulse 89, respirations 18, blood pressure 127/58. Saturations 100% on BiPAP.  GENERAL: He is a pleasant -appearing male male in mild respiratory distress.  HEAD, EYES, EARS, NOSE, THROAT: He is atraumatic, normocephalic. Extraocular muscles are intact. Pupils reactive on to light. Sclerae anicteric. No injection. No pharyngeal erythema.  NECK: Supple. There is no jugular venous distention. No bruits. No lymphadenopathy or thyromegaly.  HEART: Regular rate and rhythm. No murmurs, no rubs, no clicks.  LUNGS: He has coarse rhonchi and crackles diffusely. Positive use of accessory muscles. No dullness to percussion.  ABDOMEN: Soft, flat, nontender, nondistended. Has good bowel sounds. No hepatosplenomegaly  appreciated.  EXTREMITIES: No evidence of any cyanosis or clubbing. He does have +1  pitting edema from the knees to the ankles bilaterally, +2 pedal and radial pulses bilaterally. He also has a left upper extremity AV fistula, which has good bruit and good thrill.  NEUROLOGICAL: The patient is alert, awake and oriented x 3 with no focal motor or sensory deficits appreciated bilaterally.  SKIN: Moist and warm with no rashes.   LYMPHATIC: There is no cervical or axillary adenopathy.   LABORATORY: Showed a serum glucose of 148, BUN 67, creatinine 12.24, sodium 140, potassium is currently pending, chloride 103, bicarbonate 24. The patient's LFTs are within normal limits. Her potassium is 4.5, now. Troponin 7.4, which is trending down from his previous troponin in the 30s. White cell count 17.6, hemoglobin 10.1, hematocrit 32.5, platelet count 321.   ASSESSMENT AND PLAN: This is a 74 year old male with a history of prostate cancer, hypertension, hyperlipidemia, history of coronary artery disease, status post recent myocardial infarction. End-stage renal disease on hemodialysis. Gastroesophageal reflux disease, who was just recently discharged from the hospital, presents back due to shortness of breath, weakness and noted to be in volume overload and congestive heart failure.  1. Congestive heart failure/volume overload. This is likely due to the patient's missing his hemodialysis yesterday. We will consult nephrology and get hemodialysis done today to help him with fluid removal. Follow him clinically. Wean him off the BiPAP, as tolerated. I will continue his beta blocker and losartan for now.  2. Acute respiratory failure. This is likely secondary to congestive heart failure and volume overload. Presently, the patient remains on BiPAP. I attempted to take him off BiPAP, but he remained quite hypoxic. For now, we will admit him to the stepdown unit, wean him off the BiPAP once he gets dialyzed and gets a fluid removed. Clinically, he has no evidence of acute pneumonia or pneumothorax.  3.  Recent enterococcal bacteremia. During his last hospitalization the patient has positive blood cultures for enterococcus, although his transthoracic echocardiogram did not show any evidence of vegetations or endocarditis. The patient was already getting vancomycin at hemodialysis. For now, I will continue empiric Vanco and add some Zosyn. We will get an infectious disease consult.  4. Leukocytosis. I suspect this is stress margination versus possible acute infectious process given his recent enterococcal bacteremia. Continue empiric antibiotics with vancomycin and Zosyn. Follow white cell count.  5. Hypertension. The patient is presently hemodynamically stable. I will continue his metoprolol, losartan, Norvasc.  6. Gastroesophageal reflux disease.  Continue with his Protonix.  7. History of recent myocardial infarction. The patient's present troponin is 7. His last during his previous hospitalization was in the 30s. He is currently not having any chest pain. I will continue his beta blockers for now. The patient is apparently intolerant to statins. He had a recent catheterization done and as per cardiology they are continuing medical management for now.   The patient is a full code.   CRITICAL CARE TIME SPENT: 60 minutes.    ____________________________ Belia Heman. Verdell Carmine, MD vjs:sg D: 10/12/2013 11:43:46 ET T: 10/12/2013 12:00:19 ET JOB#: [id number]  cc: Belia Heman. Verdell Carmine, MD, <Dictator> Henreitta Leber MD ELECTRONICALLY SIGNED 10/23/2013 14:06

## 2014-07-18 NOTE — Consult Note (Signed)
PATIENT NAME:  Micheal Klein, Micheal Klein MR#:  [id number] DATE OF BIRTH:  [birth year]  DATE OF CONSULTATION:  10/13/2013  REFERRING PHYSICIAN:  Adin Hector, MD CONSULTING PHYSICIAN:  Cheral Marker. Ola Spurr, MD  REASON FOR CONSULTATION: Enterococcal bacteremia.   HISTORY OF PRESENT ILLNESS: This is a very pleasant 74 year old gentleman, history of end-stage renal disease as well as coronary artery disease, with several recent admission over the last several months for chest pain who was admitted last week with chest pain and shortness of breath. He was found to have a non-ST-elevated MI. Patient was treated medically and discharged home, and he then re-presented July 19th with shortness of breath after missing a dialysis session. Of note, on his last admission he also had blood cultures done, and these were positive for enterococcus. He was started on IV vancomycin to be given at dialysis and has been on that since cultures first became positive on July 14th. He currently feels better after having had 2 concurrent dialysis sessions with removal of fluid.   Of note, the patient denies any recent fevers, chills, night sweats, or weight loss. He has not had any problems with his hemodialysis fistula nor interventions on it that he recalls. He denies having any recent urological or gastroenterological procedures. Approximately a year ago he did have a cystoscopy by Dr. Bernardo Heater, but denies having any active issues recently. He does have a Foley catheter in currently, but denies using this at home. He denies any recent dysuria or renal stones that he is aware of. Of note, he is a relatively poor historian.   Per my review of the chart, there was some concern that he was treated recently as an outpatient at dialysis for a blood stream infection. The patient says he thinks they draw blood cultures on him every Thursday at dialysis.   PAST MEDICAL HISTORY:  1.  Enterococcal bacteremia in January and March of 2014,  treated initially with ampicillin and amoxicillin in January and then with a prolonged course of vancomycin at dialysis in March. He apparently had cleared this infection.  2.  End-stage renal disease on hemodialysis.  3.  Coronary artery disease status post catheterization at most recent admission.  4.  Hypertension.  5.  Hyperlipidemia.  6.  Prostate cancer.   SOCIAL HISTORY: Prior tobacco. No current alcohol. He lives with his wife, who is at his bedside.   FAMILY HISTORY: Positive for prostate cancer in his father.  REVIEW OF SYSTEMS: Eleven systems reviewed and negative except as per HPI.   ALLERGIES: CRESTOR, ADVIL, IBUPROFEN, LEVAQUIN, LIPITOR, PRAVACHOL, SHELLFISH.  CURRENT ANTIBIOTICS: Include vancomycin and Zosyn.   PHYSICAL EXAMINATION:  VITAL SIGNS: Temperature is 98.1. He has been afebrile since admission.   heart rate 85, blood pressure 131/70, respirations 20, saturation 96% on room air.  GENERAL: He is pleasant, interactive, on O2 by nasal cannula, in no acute distress.  HEENT: Pupils equal, round, reactive to light and accommodation. Extraocular movements are intact. Sclerae are anicteric. Oropharynx is clear, with no thrush.  NECK: Supple.  HEART: Regular with a 3/6 systolic murmur.  LUNGS: Bibasilar crackles.  ABDOMEN: Soft, nontender.  EXTREMITIES: He has 1+ edema, bilateral lower extremity. His left upper extremity has an AV fistula which is within normal limits, with a good thrill. No erythema or tenderness or drainage.  SKIN: He has no peripheral stigmata of endocarditis.   DATA: Blood cultures, 2 of 2, from July 12th growing Enterococcus faecalis sensitive to ampicillin and linezolid.  Followup blood cultures July 19th are no growth to date. Cultures from urine sample August 13th were negative. Blood cultures 05/26/2012 were negative. Blood cultures 05/25/2012 had Enterococcus faecalis in 2 of 2 and January 11th had Enterococcus faecalis. White blood count on  admission was 17.6 on July 19th; it is 8.1 today. Hemoglobin 8.7, platelets 220,000. Troponins were elevated. LFTs normal except albumin low at 2.9. Renal function is consistent with end-stage renal disease. BNP is 31,720. Urinalysis done July 20th showed 4 white cells.   IMAGING: Chest x-ray done July 13th reveals clearing of CHF and pulmonary edema. July 14th showed continued interstitial pulmonary edema, small to moderate bilateral pleural effusions. July 19th showed worsening CHF when compared to the prior study. Echocardiogram done July 12th showed EF 55% to 60%, moderate to severe LVH, moderately dilated left atrium, mild thickening and calcification of the mitral valve, mild to moderate mitral valve regurg, mild aortic regurg, mild to moderate aortic valve sclerosis without aortic stenosis, moderate tricuspid regurg.   IMPRESSION: 57.  A 74 year old gentleman with history of end-stage renal disease now with his third admission over the last 15 months for enterococcal bacteremia. He has not been admitted to [facility] with this since March of 2014. It would be quite unusual for him to have a persistent infection that whole time. There is some history, though, that he may be treated as an outpatient with antibiotics for blood stream infections and will certainly need to obtain those records.  2.  He has no evidence of peripheral stigmata of endocarditis or of metastatic sites of infection. However, given the recurrent nature of this, his odd  murmur on exam, and his recent cardiac issues, I think he would benefit from a transesophageal echocardiogram when stable to evaluate for endocarditis. This will help to guide therapy and also may help elucidate why he is having such issues with volume retention.  3.  Would continue the vancomycin. Unless the Zosyn is needed for other reasons, this can be discontinued.  Thank you for the consult. I will be glad to follow with you.     ____________________________ Cheral Marker. Ola Spurr, MD dpf:sk D: 10/13/2013 22:04:13 ET T: 10/14/2013 01:36:17 ET JOB#: [id number]  cc: Cheral Marker. Ola Spurr, MD, <Dictator> Kentarius Ola Spurr MD ELECTRONICALLY SIGNED 10/15/2013 23:55

## 2014-07-18 NOTE — Consult Note (Signed)
PATIENT NAME:  Micheal Klein, Micheal Klein MR#:  [id number] DATE OF BIRTH:  Mar 07, 1941  DATE OF CONSULTATION:  10/13/2013  REFERRING PHYSICIAN:  Dr. Ramonita Lab.  CONSULTING PHYSICIAN:  [place] D. Ranen Doolin, MD  INDICATION: Shortness of breath, possible respiratory failure.   HISTORY OF PRESENT ILLNESS: The patient is a 74 year old obese black male who presented with extreme dyspnea, shortness of breath and near respiratory failure.  He was recently discharged from the hospital with recurrent shortness of breath symptoms. The patient is a hemodialysis patient, he was supposed to get dialysis the day before but missed it because of feeling weak.  The patient presented to the hospital and was noted to have acute hypoxic respiratory failure, with oxygen saturation in the 80s on non-rebreather. Patient's chest x-ray findings were consistent with volume overload and congestive heart failure. Hospitalist services were contacted. The patient was admitted for further evaluation. He was recently here with infection and got treated and discharged. Recently had an acute myocardial infarction, that appeared to be non-Q-wave. Cardiac catheterization showed minimal significant coronary artery disease. He was treated medically.   REVIEW OF SYSTEMS: No blackout spells or syncope. No nausea or vomiting. No fever. No chills. No sweats. No weight loss. No weight gain. No hemoptysis, no hematemesis. Denies bright red blood per rectum. No vision change or hearing change. Denies sputum production. Denies cough.  Recently had myocardial infarction, end-stage renal disease, recurrent shortness of breath, Enterococcus infection recently.   ALLERGIES: CRESTOR, ADVIL, IBUPROFEN LEVAQUIN LIPITOR, PRAVACHOL SHELLFISH.   SOCIAL HISTORY: He used to smoke. He is a retired Company secretary. No alcohol consumption. Lives with his wife.   FAMILY HISTORY: Prostate cancer, hypertension.   CURRENT MEDICATIONS:  Protonix 40 mg twice a day, Crestor 100 mg  daily, Renvela 100 mg 2 tablets t.i.d., multivitamin, sublingual nitroglycerin, oxycodone 5 mg every 6 hours p.r.n., vitamin D3 1000 units daily, metoprolol 100 mg twice a day, amlodipine 10 mg daily, aspirin 325 mg a day, vancomycin at dialysis for 4 weeks.   PHYSICAL EXAMINATION:  VITAL SIGNS: Blood pressure 127/58, pulse 90, respiratory rate 18, afebrile.  HEENT: Normocephalic, atraumatic. Pupils equal and reactive to light.  NECK: Supple. No significant JVD, bruits or adenopathy.  LUNGS: Clear to auscultation and percussion without wheezing, rhonchi, or rale except in the bases.  HEART: Regular rate and rhythm. Systolic ejection murmur left sternal border. Positive S4.  ABDOMEN: Benign.  EXTREMITIES: Normal intact.  intact.  SKIN: Normal. The patient has a shunt in his left arm for dialysis.   LABORATORIES: Glucose 148, BUN 67, creatinine 12, sodium 140, chloride 103, bicarbonate 24. LFTs within normal limits. Potassium was 4.5. Troponin 7.4, trending down from previous of 30, white count of 17.6, hemoglobin 10, hematocrit 32, platelet count 321,000.   ASSESSMENT: Recent respiratory failure and volume overload, possibly from missing dialysis, possible sepsis, on vancomycin for enterococcal bacteremia.  We are concerned about possible endocarditis; leukocytosis, hypertension, history of myocardial infarction, end-stage renal disease.  PLAN:  1.  Recommend respiratory therapy, emergent dialysis since he missed treatment, to reduce volume.  2.  Infection: Recommend transesophageal echocardiogram for further evaluation. I am concerned he may have endocarditis with the recent severe bacteremia and sepsis. He is on vancomycin but may he may need further aggressive therapy.  3.  Recent myocardial infarction with no clear coronary disease, significant obstructive disease with moderately depressed left ventricular function.  4.  Continue dialysis therapy as necessary.  5.  Leukocytosis: Probably  related to sepsis, continue  broad-spectrum antibiotics.  6.  Hypertension: Continue medical therapy. He is on Norvasc for now as well as metoprolol.  7.  Reflux symptoms: Continue Protonix therapy.  8.  Would consider whether infectious disease input would be helpful or whether surgical option if he has endocarditis would be of any benefit. He is already expected to get 4 weeks of vancomycin, that may need to be extended or his antibiotic regimen may need to be adjusted, so consider ID input after TEE if he turns out to have significant vegetation.    ____________________________ Loran Senters Clayborn Bigness, MD ddc:lt D: 10/15/2013 09:27:59 ET T: 10/15/2013 10:11:50 ET JOB#: [id number]  cc: Keyleigh Manninen D. Clayborn Bigness, MD, <Dictator> Yolonda Kida MD ELECTRONICALLY SIGNED 11/10/2013 10:23

## 2014-07-18 NOTE — Discharge Summary (Signed)
PATIENT NAME:  Micheal Klein, Micheal Klein MR#:  [id number] DATE OF BIRTH:  03-Sep-1940  DATE OF ADMISSION:  10/25/2013 DATE OF DISCHARGE:  10/29/2013  TRANSFER DISCHARGE SUMMARY  FINAL DIAGNOSES:  1.  Acute myocardial infarction.  2.  Atrial fibrillation with rapid ventricular rate.  3.  Enterococcal endocarditis of the aortic valve.  4.  End-stage renal disease, on hemodialysis.  5.  Hypertension.  6.  History of urinary retention.  7.  Gastroesophageal reflux disease.  8.  History of prostate cancer.  9.  Anemia of chronic disease.  10.  Hyperlipidemia.   HISTORY AND PHYSICAL: Please see dictated admission history and physical.   [place]: The patient was admitted with increasing shortness of breath, atrial fibrillation with rapid ventricular rate, chest pain, and was found to have elevated troponins. The patient had been admitted initially on July 12 with acute myocardial infarction, and he had undergone cardiac catheterization on 10/06/2013, which revealed 50% LAD lesion, with 90% lesion of the left posterior lateral, distally, not amenable to intervention with medical management recommended. He was noted during that hospitalization, to have blood cultures that were positive for enterococcus. He was placed on vancomycin, which was dosed during dialysis. He was discharged on the 16th with the plan to follow up with infectious disease. He returned on 10/12/2013 with increasing shortness of breath. He had missed dialysis due to feeling generally poorly, and was found to have pulmonary edema at that time. He responded well to dialysis, and he underwent transesophageal echocardiogram at that time, which revealed evidence of aortic valve vegetation. ID was consulted, and gentamicin was added to the vancomycin with anticipation for a 6-week course of antibiotics, with the beginning date being 10/15/2013.   That hospitalization was complicated by atrial fibrillation with rapid rate; however,  this responded rapidly to Cardizem and he converted back into normal sinus rhythm. Consideration was made for anticoagulation at that time; however, given the ongoing need for hemodialysis, he was continued on aspirin only.   He returned to this hospital after being discharged home, on 10/25/2013, at that time with evidence of chest pain with dialysis, and acute myocardial infarction once again noted by elevated troponins. Cardiology again has seen him, and medications were adjusted. His shortness of breath improves, but is still present. He had been on 4 L nasal cannula as an outpatient, and his oxygen saturations are 90-93% on that; however, he has a continuing feeling of shortness of breath unless he is on his CPAP. Echocardiogram originally performed on 08/14/2013 had shown an LVEF of 55-60% with mild LVH, mild TR. Echocardiogram during recent hospitalization shows LVEF of 45-50% with abnormal anterior septum, now with evidence of moderate to severe MR, vegetation on the aortic valve now visible on transthoracic echocardiogram, which it was not on transesophageal echocardiogram previously, and now shows evidence of severe TR, with elevated pulmonary pressures. Of note, he did have a VQ scan back in May 2015, which was negative at that time.   After discussion with the patient's family members and discussion with the patient, they are interested in transfer of the patient to [facility]. He is followed by [facility], and it is thought that further expertise in cardiology may assist [place] in trying to determine how else to prevent further cardiac damage in this patient with ongoing shortness of breath. At this time then, he will be transferred to that facility in stable condition on 4 L nasal cannula.   His physical activity will be  up with assistance as tolerated. Further treatment will be to the discretion of [facility] physicians. He will continue to be weighed daily, watching for a more than 2 pound gain in 1 day or  5 pounds in 1 week, or increasing symptoms of heart failure. We will continue him on aspirin for anticoagulation, with IIb/IIIa inhibitors not started secondary to his hemodialysis needs. He has been INTOLERANT OF STATINS. He will not be placed on ACE or ARB at this time, as his blood pressure has not been high enough to tolerate this.   DISCHARGE MEDICATIONS:  1.  Rena-Vite 1 p.o. daily.  2.  Oxycodone 5 mg p.o. q. 6 hours as needed for pain.  3.  Vitamin D 1000 units p.o. daily.  4.  Aspirin 325 mg p.o. daily. 5.  Tamsulosin 0.4 mg p.o. at bedtime.  6.  Cardizem CD 120 mg p.o. daily.  7.  Metoprolol succinate 100 mg 1 p.o. b.i.d.  8.  Sevelamer 800 mg 3 tablets p.o. b.i.d. with meals.  9.  Pantoprazole 40 mg p.o. b.i.d.  10.  Nitroglycerin 0.4 mg sublingually every 5 minutes  p.r.n. chest pain x 3 doses.  11.  Vancomycin dosed at 750 mg once with dialysis x 5 more weeks, to complete a 6-week course. 12.  Gentamicin to be given with dialysis x 5 weeks with pharmacy to dose.  13.  Amiodarone 400 mg p.o. daily, started on 10/29/2013.   Palliative care did see the patient during his hospitalization to discuss Code Status with the patient and his family members. He did not have any firm decisions on this and remains Full Code.   Vital signs at the time of this discharge note show a pulse of 86, temperature 98.1, blood pressure 95/53, saturation 99% currently on CPAP, but 93% when on 4 L.    ____________________________ Adin Hector, MD bjk:ms D: 10/29/2013 13:45:52 ET T: 10/29/2013 14:02:14 ET JOB#: [id number]  cc: Adin Hector, MD, <Dictator> Ramonita Lab MD ELECTRONICALLY SIGNED 11/06/2013 7:28

## 2014-07-18 NOTE — Consult Note (Signed)
PATIENT NAME:  Micheal Klein, Micheal Klein MR#:  [id number] DATE OF BIRTH:  31-Jan-1941  DATE OF CONSULTATION:  10/26/2013  REFERRING PHYSICIAN:  Jeffry D. Doy Hutching, MD. CONSULTING PHYSICIAN:  Corey Skains, MD.  REASON FOR CONSULTATION: Atrial fibrillation with rapid ventricular rate with some hypotension status post dialysis.   CHIEF COMPLAINT: "I got short of breath."   HISTORY OF PRESENT ILLNESS:  This is a 74 year old male with acute respiratory failure due to pulmonary edema and Enterococcus endocarditis several weeks earlier, who had and elevated troponin consistent with subendocardial myocardial infarction, although cardiac catheterization had shown moderate 2-vessel coronary artery disease and no further intervention was needed. The patient has had atrial fibrillation with this endocarditis and spontaneously converted to normal sinus rhythm with appropriate medication management including diltiazem use. The patient has had new dialysis with hypotension and atrial fibrillation with rapid ventricular rate, and was given fluids for this and now has converted to normal rhythm after being given appropriate medication management.   He is having EKG atrial fibrillation with nonspecific ST changes and now has normal sinus rhythm. He was given heparin for further risk reduction in stroke and currently is feeling much better after having some mild chest discomfort during the episodes of atrial fibrillation with rapid ventricular rate. He does have an elevated troponin consistent with a subendocardial myocardial infarction, and although no further cardiac intervention is going to be performed at this time due to recent cardiac catheterization.    The remainder of his review of systems is negative for vision change, ringing in the ears, hearing loss, cough, congestion, heartburn, nausea, vomiting, diarrhea, bloody stools, stomach pain, extremity pain, leg weakness, cramping of the buttocks, known blood clots,  headaches, blackouts, nosebleeds, congestion, trouble swallowing, frequent urination, urination at night, muscle weakness, numbness, anxiety, depression, skin lesions or skin rashes.   PAST MEDICAL HISTORY:  1.  Pulmonary edema and respiratory failure.  2.  End-stage renal disease on hemodialysis.  3.  Endocarditis, enterococcal, on antibiotics; involving the aortic valve.  4.  Recent myocardial infarction.  5.  Hypertension.  6.  Prostate cancer.  7.  Hyperlipidemia.  8.  Coronary artery disease.   FAMILY HISTORY: Positive for prostate cancer, hypertension and diabetes.   SOCIAL HISTORY: The patient is a former smoker, he quit over 10 years ago.  He currently denies alcohol or tobacco use.   CURRENT MEDICATIONS:  As listed.   ALLERGIES: AS LISTED.   PHYSICAL EXAMINATION:  VITAL SIGNS: Blood pressure is 100/50 bilaterally, heart rate is 77 upright and reclining, regular.  GENERAL: The patient is a well-appearing male in no acute distress.  HEENT: No icterus, thyromegaly, ulcers, hemorrhage, or xanthelasma.  CARDIOVASCULAR: Regular rate and rhythm with normal S1, soft S2, with a 2/6 left sternal border decrescendo murmur and an apical murmur consistent with mitral regurgitation. PMI is inferiorly displaced. Carotid upstroke normal without bruit. Jugular venous pressure is normal.  LUNGS: Have bibasilar crackles with few rales.  ABDOMEN: Soft, nontender, without hepatosplenomegaly or masses. Abdominal aorta is normal size without bruit.  EXTREMITIES: Show 2+ femoral, trace dorsal pedal pulses, with trace lower extremity edema. No cyanosis, clubbing, or ulcers.  NEUROLOGIC: He is oriented to time, place, and person, with normal mood and affect.   ASSESSMENT: This is a 74 year old male with atrial fibrillation with rapid ventricular rate, valvular heart disease, end-stage renal disease, myocardial infarction, coronary artery disease and hypotension needing further treatment.    RECOMMENDATIONS:  1.  Continue possible hydration if necessary  due to hypotension from dialysis.  2.  Continue medication management for maintenance of normal sinus rhythm including metoprolol.  3.  Continue anticoagulation with heparin for acute myocardial infarction for 24 hours, then consider Plavix and/or aspirin antiplatelet for further risk reduction in cardiovascular event in the future.  4.  Statin therapy for further risk reduction in cardiovascular event.  5.  Further consideration of anticoagulation as an outpatient if patient has recurrence of atrial fibrillation due to high CHADS score.  6.  Proceed to dialysis without restriction.  7.  Continue antibiotics for endocarditis for a  total of 6 weeks.  8.  Begin ambulation and follow for any further significant symptoms.  9.  No further cardiac diagnostics at this time from a cardiac standpoint for recent cardiac catheterization showing no critical coronary disease, needing further intervention.  10. Possible echocardiogram for further evaluation of resolution of endocarditis and/or vegetation causing above atrial fibrillation.    ____________________________ Corey Skains, MD bjk:lt D: 10/26/2013 07:43:31 ET T: 10/26/2013 07:52:51 ET JOB#: [id number]  cc: Corey Skains, MD, <Dictator> Corey Skains MD ELECTRONICALLY SIGNED 11/05/2013 10:16

## 2014-07-18 NOTE — H&P (Signed)
PATIENT NAME:  Micheal Klein, Micheal Klein MR#:  [id number] DATE OF BIRTH:  19-Aug-1940  DATE OF ADMISSION:  10/25/2013  REFERRING PHYSICIAN: Dr. Corky Downs.   FAMILY PHYSICIAN: Dr. Ramonita Lab.   REASON FOR ADMISSION: Rapid atrial fibrillation with hypotension.   HISTORY OF PRESENT ILLNESS: The patient is a 74 year old male recently hospitalized with acute respiratory failure due to pulmonary edema with enterococcal endocarditis. He has end-stage renal disease and is on hemodialysis. He is getting gentamicin and vancomycin, had dialysis for his endocarditis. He was dialysis this morning when he developed chest pain. He was found to be in rapid atrial fibrillation and was sent to the hospital. He is currently chest pain-free. Denies shortness of breath. In the Emergency Room, the patient was given IV Cardizem with no improvement of his rapid atrial fibrillation. He was hypotensive with an elevated troponin. He is now admitted for further evaluation.   PAST MEDICAL HISTORY:  1.  Recent acute respiratory failure due to pulmonary edema.  2.  End-stage renal disease on hemodialysis.  3.  Enterococcal endocarditis on antibiotics involving the aortic valve.  4.  Recent myocardial infarction.  5.  Benign hypertension.  6.  GE reflux disease.  7.  History of prostate cancer.  8.  Anemia of chronic disease.  9.  Hyperlipidemia.  10.  History of coronary artery disease.   MEDICATIONS:  1.  Protonix 40 mg p.o. b.i.d.  2.  Losartan 100 mg p.o. daily.  3.  Renvela 800 mg 3 tablets p.o. b.i.d.  4.  Rena-Vite 1 p.o. daily.  5.  Sublingual nitroglycerin p.r.n. chest pain.  6.  Oxycodone 5 mg p.o. q. 6 hours p.r.n. pain.  7.  Vitamin D3 1000 units p.o. daily.  8.  Aspirin 325 mg p.o. daily.  9.  Toprol-XL 100 mg p.o. b.i.d.  10.  Vancomycin 750 mg dosed weekly after dialysis.  11.  Gentamicin dose per pharmacy after dialysis.  12.  Flomax 0.4 mg p.o. at bedtime.  13.  Cardizem CD 120 mg p.o. daily.   ALLERGIES:  IBUPROFEN, CRESTOR, LIPITOR, PRAVACHOL, ZETIA, SHELLFISH, AND LEVAQUIN.   SOCIAL HISTORY: The patient is a former smoker, but quit over 10 years ago. Has history of alcohol abuse, but quit many years ago. Lives at home with his wife.   FAMILY HISTORY: Positive for prostate cancer and hypertension. Also positive for diabetes.   REVIEW OF SYSTEMS:  CONSTITUTIONAL: No fever or change in weight.  EYES: No blurred or double vision. No glaucoma.  EARS, NOSE, AND THROAT: No tinnitus or hearing loss. No nasal discharge bleeding. No difficulty swallowing.  RESPIRATORY: No cough or wheezing. Denies hemoptysis.  CARDIOVASCULAR: No orthopnea or syncope.  GASTROINTESTINAL: No nausea, vomiting, or diarrhea. No abdominal pain.  GENITOURINARY: No dysuria or incontinence.  ENDOCRINE: No polyuria or polydipsia. No heat or cold intolerance.  HEMATOLOGIC: The patient denies anemia, easy bruising, or bleeding.  LYMPHATIC: No swollen glands.  MUSCULOSKELETAL: The patient has pain in his neck, back, knees, hips. No gout.  NEUROLOGIC: No numbness or migraines. Denies stroke or seizures.  PSYCHOLOGICAL: The patient denies anxiety, insomnia, or depression.   PHYSICAL EXAMINATION:  GENERAL: The patient is chronically ill-appearing, in no acute distress.  VITAL SIGNS: Currently remarkable for a blood pressure of 82/50, heart rate 139, respiratory rate of 24, saturation of 99% on 2 liters.  HEENT: Normocephalic, atraumatic. Pupils equally round and reactive to light and accommodation. Extraocular movements are intact. Sclerae are anicteric. Conjunctivae are clear. Oropharynx is clear.  NECK: Supple without JVD. No adenopathy or thyromegaly is noted.  LUNGS: Revealed basilar crackles without wheezes or rales. No dullness. Respiratory effort is normal.  CARDIAC: Irregularly irregular rhythm with a rapid rate. No significant rubs or gallops. PMI is nondisplaced. Chest wall is nontender.  ABDOMEN: Soft and nontender with  normoactive bowel sounds. No organomegaly or masses were appreciated. No hernias or bruits are noted.  EXTREMITIES: Revealed 1+ edema with stasis changes. Pulses are 2+ bilaterally.  SKIN: Warm and dry without rash or lesions.  NEUROLOGIC: Cranial nerves II-XII grossly intact. Deep tendon reflexes are symmetric. Motor and sensory exam is nonfocal.  PSYCHIATRIC: Revealed a patient who is alert and oriented to person, place, and time. He is cooperative and used good judgment.   LABORATORY DATA: EKG revealed atrial fibrillation with an initial rate of 158 beats per minute with an incomplete right bundle branch block. Chest x-ray revealed cardiomegaly with retrocardiac air space disease, which could represent edema, pneumonia versus atelectasis. His troponin was 2.2. Glucose 111 with a BUN of 16, creatinine of 4.74, and a sodium of 138 with a potassium of 3.7 with a GFR of 13. His white count is 6.7 with a hemoglobin of 8.1.   ASSESSMENT:  1.  Rapid atrial fibrillation.  2.  Hypotension.  3.  Elevated troponin.  4.  Chest pain.  5.  End-stage renal disease on hemodialysis.  6.  Anemia of chronic disease.  7.  Recent enterococcal endocarditis on antibiotics.  8.  Recent admission for respiratory failure due to pulmonary edema.   PLAN: The patient will be admitted to the Intensive Care Unit on a Cardizem drip. We will continue his beta blocker. The patient was dialyzed this morning. Will give some IV fluids for volume resuscitation in hopes of improving his blood pressure. We will follow serial cardiac enzymes and obtain an echocardiogram. Will consult cardiology. Will consult nephrology for continuing hemodialysis. We will continue his IV antibiotics for endocarditis. Follow up routine labs in the morning. We will also repeat a chest x-ray in the morning. Further treatment and evaluation will depend upon the patient's progress.   TOTAL TIME SPENT ON THIS PATIENT: 50 minutes.      ____________________________ Leonie Douglas Doy Hutching, MD jds:ts D: 10/25/2013 15:52:12 ET T: 10/25/2013 16:09:17 ET JOB#: [id number]  cc: Leonie Douglas. Doy Hutching, MD, <Dictator> Adin Hector, MD Alexi Dorminey Lennice Sites MD ELECTRONICALLY SIGNED 10/25/2013 18:14

## 2014-07-18 NOTE — Discharge Summary (Signed)
PATIENT NAME:  Micheal Klein, Micheal Klein MR#:  [id number] DATE OF BIRTH:  01/25/1941  DATE OF ADMISSION:  10/12/2013 DATE OF DISCHARGE:  10/16/2013  FINAL DIAGNOSES: 1.  Pulmonary edema.  2.  Acute respiratory failure secondary to pulmonary edema.  3.  Pulmonary edema due to end-stage renal disease and missing dialysis session.  4.  Enterococcal endocarditis involving the aortic valve.  5.  Recent myocardial infarction.  6.  Urinary retention, with catheter briefly placed.  7.  Hypertension.  8.  Gastroesophageal reflux disease.  9.  History of prostate cancer.   HISTORY AND PHYSICAL: Please see dictated admission history and physical.   HOSPITAL COURSE: The patient was admitted in pulmonary edema with acute respiratory failure after missing an episode of dialysis. He states that he simply felt too weak to go to the dialysis session. His oxygen was increased, he underwent dialysis multiple days in a row, with resolution of his symptoms. He had recently been hospitalized for acute myocardial infarction. Echocardiogram at that time showed preserved LV function and it was felt that the majority of his pulmonary edema and heart failure was due to end-stage renal disease and fluid retention. He had been sent out on home oxygen and continued to require this with oxygen saturations falling into the mid 80s with ambulation.   He recently was noted to have enterococcal bacteremia, had been on outpatient vancomycin. Infectious disease was consulted and cardiology was asked to see the patient, with transesophageal echocardiogram performed revealing a question of aortic valve vegetation. Gentamicin was added to the vanc and plan is to restart the clock on a 6 week course of vanc and gent. Nephrology was contacted. They will coordinate this with the patient's dialysis center, and the patient will need to follow up with infectious disease afterwards.   The patient did develop urinary retention, as noted above. Foley  catheter was placed with about 500 mL of urine noted. Subsequently, the catheter was removed and the patient was able to void reasonably well, with follow-up bladder scan showing minimal volume. Flomax was added. Because of his end-stage renal disease and low urine production, however, he will need to be monitored for recurrence of symptoms. If he has recurrence of symptoms, he will need urology consultation and we will recheck PSA as an outpatient.   The patient so was transferred to the intensive care unit after initially being moved out, as he developed atrial fibrillation with rapid ventricular rate. He converted into normal sinus rhythm once fluid overload was controlled. Amlodipine was changed over to Cardizem CD, and he tolerated this without issue. He remained in normal sinus rhythm throughout the remainder of his hospitalization. He is not a full anticoagulation candidate secondary to his requirement for hemodialysis. He has been maintained on aspirin after his myocardial infarction, and fortunately he has tolerated this. Further adjustments to this will be left to the discretion of cardiology and nephrology.   At this time, the patient will be discharged to home in stable condition. His physical activity will be up as tolerated. He will continue on oxygen 2 liters nasal cannula. He will follow up with dialysis as scheduled. We will have him follow up with infectious disease in the next 1 week, in our office in the next 2 weeks, and he has follow-up with cardiology already, and he will keep his appointment. His diet should be 2 grams sodium, renal diet. The recommendation was for him to weigh himself daily, calling for more than 2 pounds gain  in one day or 5 pounds in one week or any increased symptoms of heart failure, although again this was felt to be secondary to missing dialysis. Home health physical therapy and nursing was ordered for the patient to monitor blood pressure, heart rate, temperature,  and improve ambulation and endurance.   DISCHARGE MEDICATIONS: 1. Protonix 40 mg p.o. b.i.d.  2.  Losartan 100 mg p.o. daily.  3.  Renvela 800 mg 3 tablets p.o. b.i.d.  4.  Rena-Vite 1 p.o. daily.  5.  Nitroglycerin 0.4 mg sublingually every 5 minutes x3 as needed for chest pain.  6.  Oxycodone 5 mg p.o. q. 6 hours as needed for pain.  7.  Vitamin D 1000 units p.o. daily.  8.  Aspirin 325 mg p.o. daily.  9.  Metoprolol succinate 100 mg p.o. b.i.d.  10.  Vancomycin 750 mg to be dosed after dialysis x6 weeks.  11.  Gentamicin to be dosed after dialysis x6 weeks, with the dose to be determined by pharmacy.  12.  Tamsulosin 0.4 mg p.o. at bedtime.  13.  Cardizem CD 120 mg p.o. daily.   NOTE: He was given instructions to stop amlodipine.  ____________________________ Adin Hector, MD bjk:sb D: 10/16/2013 08:06:27 ET T: 10/16/2013 08:27:09 ET JOB#: [id number]  cc: Adin Hector, MD, <Dictator> Ramonita Lab MD ELECTRONICALLY SIGNED 10/20/2013 8:10

## 2014-07-18 NOTE — H&P (Signed)
PATIENT NAME:  Micheal Klein, Micheal Klein MR#:  [id number] DATE OF BIRTH:  1940-04-25  DATE OF ADMISSION:  10/05/2013  REFERRING PHYSICIAN: Dr. Corky Downs.   FAMILY PHYSICIAN: Dr. Ramonita Lab.   REASON FOR ADMISSION: Acute myocardial infarction.   HISTORY OF PRESENT ILLNESS: The patient is a 74 year old male with a history of end-stage renal disease, on hemodialysis. The patient also has a significant history of obstructive sleep apnea on CPAP, hyperlipidemia, benign hypertension, and GE reflux disease. Was recently hospitalized with elevated troponin and was discharged home for further cardiac outpatient work-up. He is being followed by Dr. Clayborn Bigness. Presents to the Emergency Room today with worsening shortness of breath where he was found to be profoundly hypoxic requiring 5 liters of oxygen. His troponin was 14 and his chest x-ray suggests congestive heart failure despite hemodialysis yesterday. Denies actual chest pain. He is now admitted for further evaluation.   PAST MEDICAL HISTORY: 1. End-stage renal disease on hemodialysis.  2. Presumed coronary artery disease by recent hospitalization.  3. Benign hypertension.  4. Hyperlipidemia.  5. GE reflux disease.  6. Obstructive sleep apnea, on CPAP.  7. Osteoarthritis.  8. Remote history of prostate cancer.  9. History of GI bleeds due to nonsteroidal anti-inflammatory use.   MEDICATIONS: 1. Norvasc 10 mg p.o. daily.  2. Losartan 100 mg p.o. daily.  3. Nitrostat 0.4 mg sublingually p.r.n. chest pain.  4. Aspirin 325 mg p.o. daily.  5. Norco 5/325, 1 p.o. q.6 hours p.r.n. pain.  6. Reno-Vite 1 p.o. daily.  7. Renvela 2400 mg p.o. t.i.d. with meals.  8. Toprol-XL 100 mg p.o. b.i.d.  9. Protonix 40 mg p.o. b.i.d.  10. Cardura 8 mg p.o. at bedtime.  11. Uloric 40 mg p.o. daily.   ALLERGIES: IBUPROFEN, CRESTOR, LEVAQUIN, LIPITOR, PRAVACHOL, ZETIA, AND SHELLFISH.   SOCIAL HISTORY: Negative for alcohol or tobacco abuse.   FAMILY HISTORY: Positive for  lung cancer, otherwise unremarkable. Negative for colon cancer, prostate cancer. Negative for heart disease.   REVIEW OF SYSTEMS:  CONSTITUTIONAL: No fever. Has had weight gain.  EYES: No blurred or double vision. No glaucoma.  ENT: No tinnitus or hearing loss. No nasal discharge or bleeding. No difficulty swallowing.  RESPIRATORY: The patient has a cough, no wheezing. No hemoptysis.  CARDIOVASCULAR: No chest pain or palpitations. No syncope.  GASTROINTESTINAL: No nausea, vomiting, or diarrhea. No abdominal pain.  GENITOURINARY: No dysuria or hematuria. No incontinence.  ENDOCRINE: No polyuria or polydipsia. No heat or cold intolerance.  HEMATOLOGIC: The patient denies easy bruising or bleeding.  LYMPHATIC: No swollen glands.  MUSCULOSKELETAL: The patient has pain in his neck, back, shoulders, knees, hips. No recent gout flares.  NEUROLOGIC: No numbness or migraines. Denies stroke or seizures.  PSYCHIATRIC: The patient denies anxiety, insomnia or depression.   PHYSICAL EXAMINATION: GENERAL: The patient is acutely ill-appearing, in moderate respiratory distress.  VITAL SIGNS: Currently remarkable for a blood pressure of 122/68, with a heart rate of 102 and a respiratory rate of 24 and a sat of 90% on 5 liters.  HEENT: Normocephalic, atraumatic. Pupils equally round and reactive to light and accommodation. Extraocular movements are intact. Sclerae anicteric. Conjunctivae are clear.  OROPHARYNX: Clear.   NECK: Supple with JVD noted to the angle of the jaw. No adenopathy or thyromegaly is present.  LUNGS: Reveal basilar rales with scattered wheezes. No rhonchi. No dullness. Respiratory effort is increased.  CARDIAC: Regular rate and rhythm with a normal S1 and S2. No significant rubs, murmurs or gallops.  PMI is nondisplaced. Chest wall is nontender.  ABDOMEN: Soft, nontender, with normoactive bowel sounds. No organomegaly or masses were appreciated. No hernias or bruits were noted.   EXTREMITIES: Revealed 2+ edema with stasis changes. Pulses were 2+ bilaterally.  SKIN: Warm and dry without rash or lesions.  NEUROLOGIC: Revealed cranial nerves II through XII grossly intact. Deep tendon reflexes were symmetric. Motor and sensory exam is nonfocal.  PSYCHIATRIC: Revealed a patient who is alert and oriented to person, place, and time. He was cooperative and used good judgment.   LABORATORY DATA: EKG revealed sinus rhythm with an old septal myocardial infarction but no acute ischemic changes. Chest x-ray showed congestive heart failure. His white count was 12.9 with a hemoglobin of 9.3. Troponin was 14.00. Glucose 118 with a BUN of 32, creatinine of 8.8 and a GFR of 6. His CK-MB was 26.6.   ASSESSMENT: 1. Acute non-ST elevation myocardial infarction.  2. Acute on chronic congestive heart failure.  3. Acute respiratory failure.  4. End-stage renal disease on hemodialysis.  5. Anemia of chronic disease.   PLAN: The patient will be admitted to the Intensive Care Unit as a full code on a heparin drip. We will try IV Lasix once for diuresis. We will consult cardiology and nephrology urgently as the patient may need repeat hemodialysis because of his congestive heart failure. We will follow serial cardiac enzymes and obtain an echocardiogram. Nitrostat and morphine as needed for pain. We will supplement oxygen at this time and wean as tolerated. Follow up routine labs and chest x-ray in the morning. Further treatment and evaluation will depend upon the patient's progress.   TOTAL TIME SPENT ON THIS PATIENT: 50 minutes.   ____________________________ Leonie Douglas Doy Hutching, MD jds:sg D: 10/05/2013 12:02:37 ET T: 10/05/2013 13:08:04 ET JOB#: [id number]  cc: Leonie Douglas. Doy Hutching, MD, <Dictator> Adin Hector, MD   JEFFREY Lennice Sites MD ELECTRONICALLY SIGNED 10/05/2013 15:26

## 2014-07-18 NOTE — Consult Note (Signed)
PATIENT NAME:  KLYE, BESECKER MR#:  [id number] DATE OF BIRTH:  1940-04-18  DATE OF CONSULTATION:  10/28/2013  CONSULTING PHYSICIAN:  Corey Skains, MD  The patient continues to maintain normal sinus rhythm with appropriate medication management including diltiazem and metoprolol and does still have some anemia and endocarditis, which is continuing to be treated.  He tolerated dialysis fairly well and therefore will continue to have appropriate treatment listed above with no evidence of new physical examination abnormality.     ____________________________ Corey Skains, MD bjk:lt D: 10/29/2013 08:28:32 ET T: 10/29/2013 10:33:51 ET JOB#: [id number]  cc: Corey Skains, MD, <Dictator> Corey Skains MD ELECTRONICALLY SIGNED 11/05/2013 10:16

## 2014-07-18 NOTE — H&P (Signed)
PATIENT NAME:  Micheal Klein, Micheal Klein MR#:  [id number] DATE OF BIRTH:  03-08-1941  DATE OF ADMISSION:  08/13/2013  REFERRING PHYSICIAN:  Dr. Robet Leu.   PRIMARY CARE PHYSICIAN:  Dr. Ramonita Lab, [facility].   CARDIOLOGY:  Dr. Clayborn Bigness, [facility].   CHIEF COMPLAINT:  Chest pain.   HISTORY OF PRESENT ILLNESS:  This is a 74 year old African American gentleman with past medical history of end-stage renal disease on hemodialysis Tuesday, Thursday, Saturday, hypertension as well as hyperlipidemia, presenting with chest pain.  He describes chest pain which occurred at rest, intermittent episodes over the last two days in duration, located over left chest, quality dull, intensity 8 out of 10 without relieving factors,  somewhat worsened pain with movement and palpation when actively having pain.  He also states it was possibly pleuritic; however, denies any nausea, vomiting, shortness of breath, palpitations, further symptomatology.  Currently he is without complaints.  Denies any active chest pain.  On initial ER work-up found to have mildly elevated troponin in the setting of end-stage renal disease.  EKG within normal limits.   REVIEW OF SYSTEMS:  CONSTITUTIONAL:  Denies fevers, fatigue, weakness.  EYES:  Denied blurred vision, double vision, eye pain.  EARS, NOSE, THROAT:  Denies tinnitus, ear pain, hearing loss.  RESPIRATORY:  Denies cough, wheeze, shortness of breath.  CARDIOVASCULAR:  Positive for chest pain as described above; however, denies any orthopnea, palpitations, or edema.  GASTROINTESTINAL:  Denies nausea, vomiting, diarrhea, abdominal pain.  GENITOURINARY:  Denies dysuria, hematuria. ENDOCRINE:  Denies nocturia or thyroid problems. HEMATOLOGY AND LYMPHATIC:  Denies easy bruising, bleeding.  SKIN:  Denies rash or itch.  MUSCULOSKELETAL:  Denies pain in neck, back, shoulder, knees, hips or arthritic symptoms.  NEUROLOGIC:  Denies paralysis, paresthesias. PSYCHIATRIC:  Denies  anxiety or depressive symptoms. Otherwise, full review of systems performed by me is negative.   PAST MEDICAL HISTORY:  End-stage renal disease on hemodialysis Tuesday, Thursday, Saturday, hypertension, hyperlipidemia, gastroesophageal reflux disease, obstructive sleep apnea on CPAP therapy at bedtime, osteoarthritis, remote prostate cancer.  Does have a history of GI bleed related to NSAIDs.   SOCIAL HISTORY:  Denies any alcohol, tobacco or drug usage.   FAMILY HISTORY:  Positive for lung cancer, however, no family history of cardiovascular diseases.   ALLERGIES:  ADVIL, CRESTOR, IBUPROFEN, LEVAQUIN, LIPITOR, PRAVACHOL, ZETIA AS WELL AS SHELLFISH.   HOME MEDICATIONS:  Include acetaminophen hydrocodone 325/5 mg by mouth q. 4-6 hours as needed for pain, Cardura 8 mg by mouth at bedtime, Uloric 40 mg by mouth daily as needed for gout, Toprol-XL 100 mg by mouth twice daily, Norvasc 10 mg by mouth daily, Renvela 800 mg 3 tablets by mouth 2 times daily with meals, Protonix 40 mg by mouth twice daily, multivitamin 1 tablet by mouth daily.   PHYSICAL EXAMINATION: VITAL SIGNS:  Temperature 98.4, heart rate 79, respirations 18, blood pressure 139/91, saturating 93% on room air.  Weight 121.1 kg.  GENERAL:  Well-nourished, well-developed [place] American gentleman, currently in no acute distress.  HEAD:  Normocephalic, atraumatic.  EYES:  Pupils equal, round, reactive to light.  Extraocular muscles intact.  No scleral icterus.  MOUTH:  Moist mucous membranes.  Dentition intact.  No abscess noted.  EARS, NOSE AND THROAT:  Throat clear without exudates.  No external lesions.  NECK:  Supple.  No thyromegaly.  No nodules.  No JVD.  PULMONARY:  Clear to auscultation bilaterally without wheezes, rales, or rhonchi.  No use of accessory muscles.  Good  respiratory effort.  CHEST:  Nontender to palpation.  CARDIOVASCULAR:  S1, S2, regular rate and rhythm.  No murmurs, rubs or gallops.  No edema.  Pedal pulses 2+  bilaterally.  GASTROINTESTINAL:  Soft, nontender, nondistended.  No masses.  Positive bowel sounds.  No hepatosplenomegaly.  MUSCULOSKELETAL:  No swelling, clubbing, or edema.  Range of motion full in all extremities.  NEUROLOGIC:  Cranial nerves II through XII intact.  No gross focal neurological deficits.  Sensation to intact.  Reflexes intact. SKIN:  No ulcerations, lesions, rashes or cyanosis.  Skin warm, dry.  Turgor intact.  PSYCHIATRIC:  Mood and affect within normal limits.  The patient is awake, alert and oriented x 3.  Insight and judgment intact.   LABORATORY DATA:  EKG performed revealing left ventricular hypertrophy, however no significant ST or T wave abnormalities and which appears to be unchanged from prior EKGs.  Chest x-ray performed revealing stable cardiomegaly as well as there is some mention of interstitial prominence of the left basal lingula.  Remainder of laboratory data:  Sodium 139, potassium 3.8, chloride 105, bicarb 26, BUN 49, creatinine 11.41, glucose 98.  Troponin I 0.1.  WBC 7.2, hemoglobin 11.3, platelets of 177.   ASSESSMENT AND PLAN:  A 74 year old African American gentleman with past medical history of end stage renal disease on hemodialysis, hypertension, hyperlipidemia presenting with chest pain.  1.  Chest pain with mild elevated troponin in the setting of end-stage renal disease on hemodialysis.  We will trend cardiac enzymes x 3 in total, place on telemetry under observational status, received aspirin.  We will continue aspirin and statin therapy.  Consult cardiology.  He follows with Dr. Clayborn Bigness of cardiology.  As far as further etiologies of chest pain, musculoskeletal is a possibility, does appear to be worsening on palpation whenever he was actively having symptoms.  Pulmonary embolus is unlikely given Wells criteria of 0, putting him at a low risk of 1.3% chance of pulmonary embolus.  2.  End-stage renal disease on hemodialysis:  Consult nephrology for  continuation of hemodialysis.  3.  Hypertension.  Continue with Norvasc, Cardura, losartan, Toprol.  4.  Gastroesophageal reflux disease.  Continue proton pump inhibitor therapy.  5.  Obstructive sleep apnea.  Continue with CPAP at night.  6.  Venous thromboembolism prophylaxis with heparin subQ.  7.  CODE STATUS:  THE PATIENT IS A FULL CODE.   TIME SPENT:  45 minutes.     ____________________________ Aaron Mose. Kollins Fenter, MD dkh:ea D: 08/13/2013 22:56:21 ET T: 08/13/2013 23:42:47 ET JOB#: [id number]  cc: Aaron Mose. Camren Henthorn, MD, <Dictator> Jayanth Woodfin Ganja MD ELECTRONICALLY SIGNED 08/14/2013 20:28

## 2014-07-18 NOTE — Discharge Summary (Signed)
PATIENT NAME:  Micheal Klein, Micheal Klein MR#:  [id number] DATE OF BIRTH:  09-May-1940  DATE OF ADMISSION:  10/05/2013 DATE OF DISCHARGE:  10/09/2013  FINAL DIAGNOSES:  1.  Acute myocardial infarction.  2.  Acute respiratory failure secondary to pulmonary edema.  3.  Pulmonary edema secondary to acute myocardial infarction.  4.  End-stage renal disease on hemodialysis.  5.  Enterococcal bacteremia.  6.  Hypertension.  7.  Hyperlipidemia.  8.  Gastroesophageal reflux disease.  9.  Sleep apnea, on CPAP at night.  10.  Osteoarthritis.  11.  Remote history of prostate cancer.   HISTORY AND PHYSICAL: Please see dictated admission history and physical.   HOSPITAL COURSE: The patient was admitted after finding of acute myocardial infarction with acute respiratory failure and finding of pulmonary edema. He underwent dialysis for removal of fluid, with improvement in his respiratory status. Cardiology saw the patient and he underwent cardiac catheterization on 10/06/2013, which revealed an LVEF of 50% with 50%-70% mid LAD lesions and moderate coronary disease in the RCA, with medical management recommended. This was made more challenging by the fact that the patient is statin intolerant. His blood pressure has been low at times with dialysis, and blood pressure medications were adjusted. He was continued on beta blocker and ARB. He had been on doxazosin and this was held,  as his blood pressure was stable without it. Aspirin was increased.   Blood cultures were obtained on arrival, and these were positive for enterococcus in multiple bottles. This was sensitive to antibiotics up to all antibiotics tested. He had been started on vancomycin. Because of dialysis, at this time, the plan will be to continue treatment with vancomycin. Echocardiogram was performed, which revealed some thickening of the heart valves, but no evidence of vegetation. Infectious disease was not available during his hospitalization, and so the  plan will be to discuss with them as an outpatient regarding the need for TEE. We will anticipate repeating the patient's blood cultures upon follow-up in the office.   The patient discharged home in stable condition. His physical activity is to be up as tolerated. He should weigh himself daily, calling for more than 2 pound gain in 1 day or 5 pounds in 1 week or increasing signs or symptoms of heart failure. He will follow up in dialysis in 2 days. He will follow up with cardiology in my office in the next 2 weeks. He will require home oxygen for saturation 88% on room air, but was on 2 liters nasal continuously. Should follow a 2 gram sodium, renal diet. His physical activity will be up as tolerated. He should check his blood pressure daily and record this.   DISCHARGE MEDICATIONS:  1.  Renvela 800 mg 3 tablets p.o. b.i.d.  2.  Rena-Vite 1 p.o. daily.  3.  Nitroglycerin 0.4 mg sublingually q. 5 minutes x 3 p.r.n. chest pain.  4.  Oxycodone 5 mg p.o. q. 6 hours as needed for pain.  5.  Vitamin D 1000 units p.o. daily.  6.  Toprol-XL 100 mg p.o. b.i.d.  7.  Amlodipine 10 mg p.o. daily.  8.  Aspirin 325 mg p.o. daily.  9.  Vancomycin 750 mg after each dialysis x 4-5 additional weeks.   At this point, he will hold Cardura, arthritis, clarithromycin, spironolactone, and Cheratussin.     ____________________________ Adin Hector, MD bjk:ts D: 10/09/2013 12:54:00 ET T: 10/09/2013 13:13:30 ET JOB#: [id number]  cc: Adin Hector, MD, <Dictator>  Dwayne D. Clayborn Bigness, MD Murlean Iba, MD    Ramonita Lab MD ELECTRONICALLY SIGNED 10/16/2013 8:22

## 2014-07-18 NOTE — Discharge Summary (Signed)
PATIENT NAME:  Micheal Klein, Micheal Klein MR#:  [id number] DATE OF BIRTH:  08-08-1940  DATE OF ADMISSION:  10/05/2013 DATE OF DISCHARGE:  10/08/2013  FINAL DIAGNOSES:  1.  Acute myocardial infarction.  2.  Acute respiratory failure secondary to pulmonary edema.  3.  Pulmonary edema secondary to acute myocardial infarction.  4.  End-stage renal disease on hemodialysis.  5.  Enterococcal bacteremia.  6.  Hypertension.  7.  Hyperlipidemia.  8.  Gastroesophageal reflux disease.  9.  Sleep apnea, on CPAP at night.  10.  Osteoarthritis.  11.  Remote history of prostate cancer.   HISTORY AND PHYSICAL: Please see dictated admission history and physical.   HOSPITAL COURSE: The patient was admitted after finding of acute myocardial infarction with acute respiratory failure and finding of pulmonary edema. He underwent dialysis for removal of fluid, with improvement in his respiratory status. Cardiology saw the patient and he underwent cardiac catheterization on 10/06/2013, which revealed an LVEF of 50% with 50%-70% mid LAD lesions and moderate coronary disease in the RCA, with medical management recommended. This was made more challenging by the fact that the patient is statin intolerant. His blood pressure has been low at times with dialysis, and blood pressure medications were adjusted. He was continued on beta blocker and ARB. He had been on doxazosin and this was held,  as his blood pressure was stable without it. Aspirin was increased.   Blood cultures were obtained on arrival, and these were positive for enterococcus in multiple bottles. This was sensitive to antibiotics up to all antibiotics tested. He had been started on vancomycin. Because of dialysis, at this time, the plan will be to continue treatment with vancomycin. Echocardiogram was performed, which revealed some thickening of the heart valves, but no evidence of vegetation. Infectious disease was not available during his hospitalization, and so the  plan will be to discuss with them as an outpatient regarding the need for TEE. We will anticipate repeating the patient's blood cultures upon follow-up in the office.   The patient discharged home in stable condition. His physical activity is to be up as tolerated. He should weigh himself daily, calling for more than 2 pound gain in 1 day or 5 pounds in 1 week or increasing signs or symptoms of heart failure. He will follow up in dialysis in 2 days. He will follow up with cardiology in my office in the next 2 weeks. He will require home oxygen for saturation 88% on room air, but was on 2 liters nasal continuously. Should follow a 2 gram sodium, renal diet. His physical activity will be up as tolerated. He should check his blood pressure daily and record this.   DISCHARGE MEDICATIONS:  1.  Renvela 800 mg 3 tablets p.o. b.i.d.  2.  Rena-Vite 1 p.o. daily.  3.  Nitroglycerin 0.4 mg sublingually q. 5 minutes x 3 p.r.n. chest pain.  4.  Oxycodone 5 mg p.o. q. 6 hours as needed for pain.  5.  Vitamin D 1000 units p.o. daily.  6.  Toprol-XL 100 mg p.o. b.i.d.  7.  Amlodipine 10 mg p.o. daily.  8.  Aspirin 325 mg p.o. daily.  9.  Vancomycin 750 mg after each dialysis x 4-5 additional weeks.   At this point, he will hold Cardura, arthritis, clarithromycin, spironolactone, and Cheratussin.     ____________________________ Adin Hector, MD bjk:ts D: 10/09/2013 12:54:28 ET T: 10/09/2013 13:13:30 ET JOB#: [id number]  cc: Adin Hector, MD, <Dictator> Dwayne  Prince Rome, MD Murlean Iba, MD

## 2014-07-18 NOTE — Consult Note (Signed)
PATIENT NAME:  Micheal Klein, Micheal Klein MR#:  [id number] DATE OF BIRTH:  1940-10-10  DATE OF CONSULTATION:  08/14/2013  REFERRING PHYSICIAN:   CONSULTING PHYSICIAN:  Parneet Glantz D. Clayborn Bigness, MD  PRIMARY CARE PHYSICIAN: Ramonita Lab, MD  INDICATION: Chest pain.  HISTORY OF PRESENT ILLNESS: Micheal Klein is a 74 year old obese African American male with endstage renal disease, hypertension, GERD, obesity, obstructive sleep apnea, and hyperlipidemia presented with chest pain symptoms, occurring at rest, intermittent, over 1 to 2 days, moderate in nature, dull quality. At times it is 8 out of 10. Nothing he did relieved it. Because of the significance of the pain, he came to the emergency. He denied any nausea or vomiting. He was slightly short of breath. He had mild palpitations. By the time he came to the Emergency Room, pain was resolved but he was admitted for further evaluation and care.   REVIEW OF SYSTEMS: No blackout spells or syncope. No nausea or vomiting. No fever. No chills. No sweats. No weight loss or weight gain. No hemoptysis or hematemesis or bright red blood per rectum. No vision change or hearing change. Denies sputum production or cough.  PAST MEDICAL HISTORY: End-stage renal disease, does dialysis on Tuesday, Wednesday, and Saturday, hypertension, hyperlipidemia, reflux, obstructive sleep apnea, osteoarthritis, history of prostate cancer, history of GI bleed on NSAIDs.   SOCIAL HISTORY: Married. Retired. No recent smoking or alcohol consumption.   FAMILY HISTORY: Lung cancer and hypertension, otherwise negative.  ALLERGIES: SHELLFISH, CRESTOR, IBUPROFEN, LEVAQUIN, LIPITOR, PRAVACHOL, ZETIA.  HOME MEDICATIONS: Currently the patient is on Tylenol with codeine q. 4 to 6 hours p.r.n., Cardura 8 mg at bedtime, Uloric 40 mg daily, Toprol-XL 100 mg twice a day, Norvasc 10 mg once a day, Renvela 800 mg 3 tablets 2 times a day, Protonix 40 twice a day, multivitamin once a day.  PHYSICAL  EXAMINATION: VITAL SIGNS: Blood pressure 140/90, pulse 80, respiratory rate 16. HEENT: Normocephalic, atraumatic. Pupils equal, round, reactive to light.   NECK: Supple. No significant JVD, bruits or adenopathy.  LUNGS: Clear to auscultation and percussion. No significant wheeze, rhonchi, or rales.  HEART: Regular rate and rhythm. Systolic ejection murmur at left sternal border. Positive S4.  ABDOMEN: Benign. EXTREMITIES: Within normal limits.  NEUROLOGIC: Intact.  SKIN: Normal.   DIAGNOSTIC DATA: EKG: Normal sinus rhythm with LVH. No significant ST or T wave abnormalities.    Chest x-ray: Cardiomegaly, otherwise negative.   Sodium 139, potassium 3.8, chloride 105, bicarb 26, BUN 49, creatinine 11, glucose 98. Troponin I was 0.1. White count 7.2, hemoglobin 11, platelet count 177,000.  ASSESSMENT: 1.  Chest pain, possible angina.  2.  End-stage renal disease. 3.  Hypertension. 4.  Obstructive sleep apnea. 5.  Reflux. 6.  Obesity. 7.  Hyperlipidemia. 8.  Abnormal EKG.   PLAN:  1.  Agree with admit. Rule out for myocardial infarction. Follow cardiac enzymes, followup EKG, continue telemetry. If he rules out would then recommend outpatient evaluation, probably with a functional study  Have him follow up as an outpatient.  2.  Endstage renal disease: Continue dialysis therapy. Continue current care. Have the patient follow up with nephrology as an outpatient. 3.  Hypertension: Continue hypertension medications of metoprolol and amlodipine with reasonable blood pressure control right now, as well as continue dialysis for fluid balance. 4.  Gastroesophageal reflux disease: Continue Protonix at this time. Appears to be tolerating it well.  5.  Gout.  current medications. 6.  Obstructive sleep apnea. Continue CPAP therapy. Recommend weight  loss.   Again, if the patient remains stable, recommend outpatient follow up with a functional study, possibly a Myoview and echocardiogram. He should  be stable enough for discharge provided he has no further symptoms.  ____________________________ Loran Senters Clayborn Bigness, MD ddc:sb D: 08/15/2013 06:55:14 ET T: 08/15/2013 07:34:19 ET JOB#: [id number]  cc: Mayvis Agudelo D. Clayborn Bigness, MD, <Dictator> Yolonda Kida MD ELECTRONICALLY SIGNED 09/15/2013 0:55

## 2014-07-18 NOTE — Discharge Summary (Signed)
PATIENT NAME:  Micheal Klein, Micheal Klein MR#:  [id number] DATE OF BIRTH:  01/10/1941  DATE OF ADMISSION:  08/13/2013 DATE OF DISCHARGE:  08/15/2013  DIAGNOSES AT THE TIME OF DISCHARGE: 1. Chest pain with elevated cardiac enzymes.  2. End-stage renal disease on hemodialysis.  3. Hypertension.  4. Gastroesophageal reflux disease.  5. Obstructive sleep apnea.   CHIEF COMPLAINT: Chest pain.   HISTORY OF PRESENT ILLNESS: Micheal Klein is a 74 year old African American male with a history of end-stage renal disease on dialysis who presented to the ED complaining of chest pain, which he described as dull, located on the left side of the chest, somewhat worse with movement, and sometimes hurting with deep inspiration, but denied any nausea, vomiting, no shortness of breath, no palpitations. The patient was chest pain-free on admission.   PHYSICAL EXAMINATION:  GENERAL: He is afebrile. Heart rate 79, respirations 18, blood pressure 139/91, O2 saturation 93% on room air, weight 121 kg, not in distress.  HEART: S1, S2.  LUNGS: Clear.  ABDOMEN: Soft, nontender.  EXTREMITIES: No edema.   LABORATORY STUDIES: EKG showed evidence of LVH without significant ST-T changes. Chest x-ray revealed stable cardiomegaly. The patient's troponin was 0.10, 0.09, and 0.08 respectively. The V/Q scan showed no evidence of acute pulmonary embolism. There was evidence of cardiomegaly. During his stay in the hospital, the patient was seen by cardiologist, Dr. Ubaldo Glassing. Unfortunately, he could not have a Myoview stress test because of radio isotope used for V/Q. The patient himself was chest pain free and keen to go home. After discussing with Dr. Ubaldo Glassing, it was felt that  the patient be stable for discharge and will have an outpatient Myoview stress test.   MEDICATIONS ON DISCHARGE: Nitroglycerin 0.4 mg sublingual p.r.n. for chest pain if necessary, aspirin 325 mg once a day, hydrocodone APAP 5/325 one tablet q. 4-6 hours p.r.n., Rena-Vite  with B complex 1 tablet once a day, and Norvasc 10 mg once a day, Renvela 800 mg 3 tablets twice a day with meals, Cozaar 100 mg once a day, Toprol-XL 100 mg b.i.d., Protonix 40 mg b.i.d., Cardura 8 mg once a day, and Uloric 40 mg once a day.   FOLLOWUP: The patient has been advised to follow up with Dr. Ramonita Lab and also call the office and try to schedule a Myoview stress test on Tuesday, 08/19/2013. The patient was ambulated and was chest pain free at the time of discharge. He has been advised to report to the ER if he has worsening chest pain at any time.   Total time spent in discharge of this patient :35 minutes ____________________________ Tracie Harrier, MD vh:lt D: 08/15/2013 18:48:08 ET T: 08/16/2013 04:45:52 ET JOB#: [id number]  cc: Tracie Harrier, MD, <Dictator> Tracie Harrier MD ELECTRONICALLY SIGNED 09/02/2013 13:29

## 2014-12-09 IMAGING — CT CT ABDOMEN WO/W CM
3 of 10 series · 11 of 46 positions shown, 17 images · IV contrast (isovue)
Comparison: 02/12/2013.

CLINICAL DATA: Left renal lesion

EXAM:
CT ABDOMEN AND PELVIS WITHOUT AND WITH CONTRAST
TECHNIQUE: Multidetector CT imaging of the abdomen and pelvis was performed
following the standard protocol before and following the bolus
administration of intravenous contrast.
CONTRAST:  100 cc Isovue 370.

[Series 2: renal without · axial · non-contrast · 0.94mm/px · z∈[-1051,-855]mm · 7 of 132 slices shown, 12 images]
[im 17/132  soft-tissue]
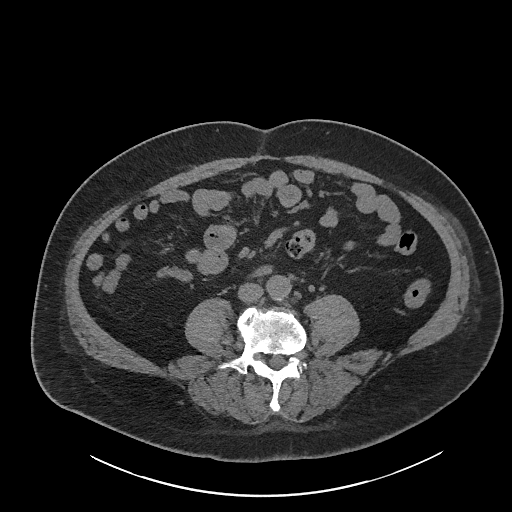
[im 17/132  bone]
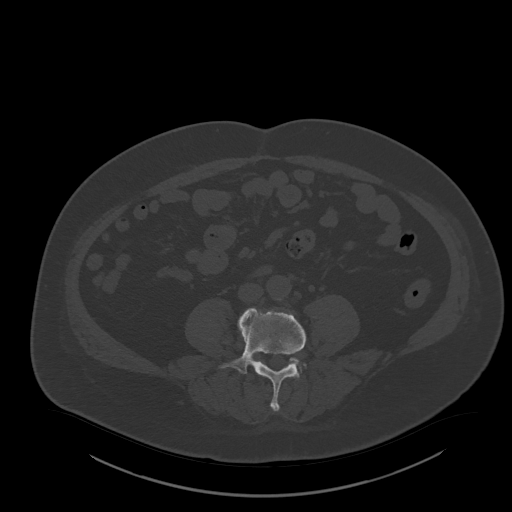
[im 33/132  soft-tissue]
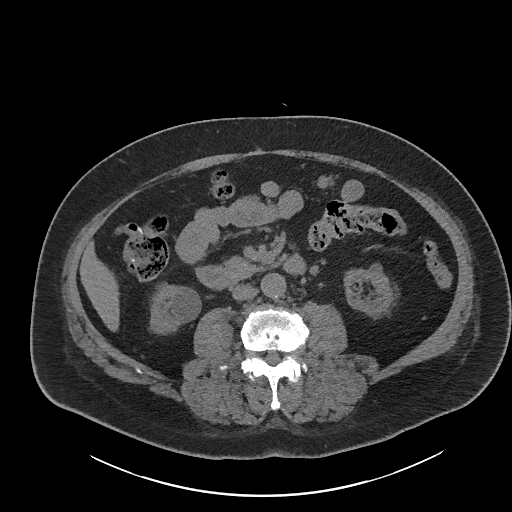
[im 50/132  soft-tissue]
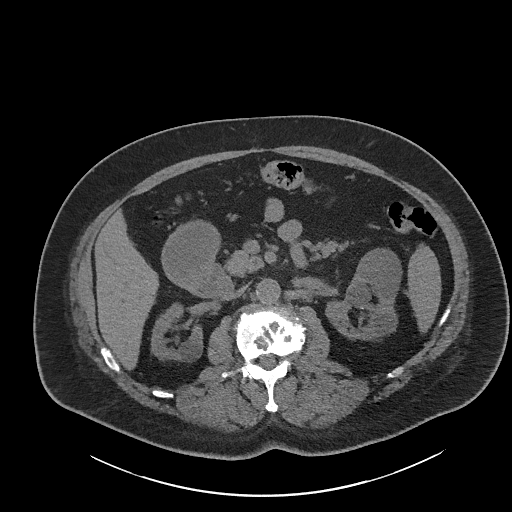
[im 66/132  soft-tissue]
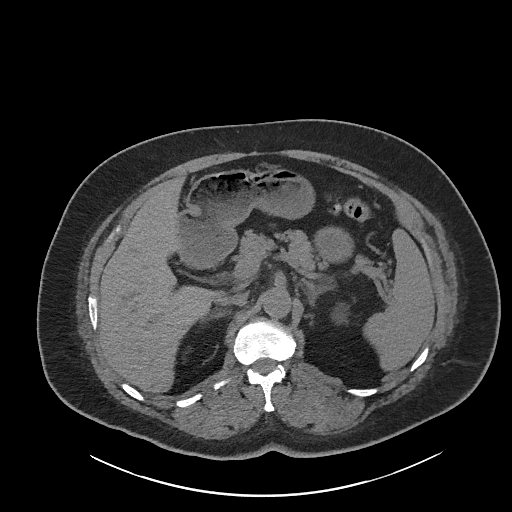
[im 66/132  lung]
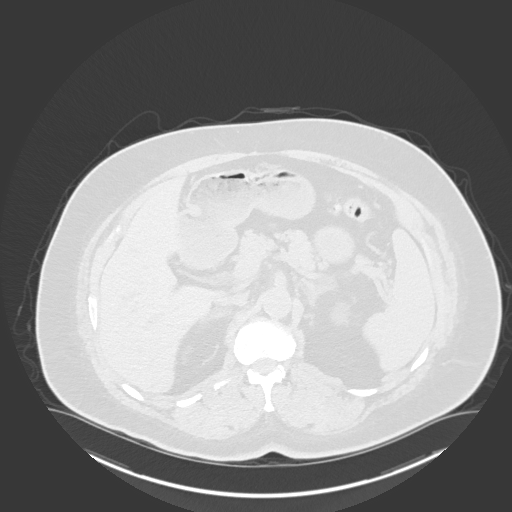
[im 82/132  soft-tissue]
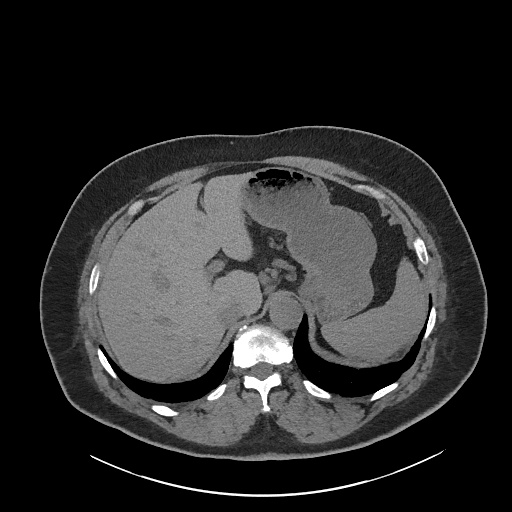
[im 82/132  lung]
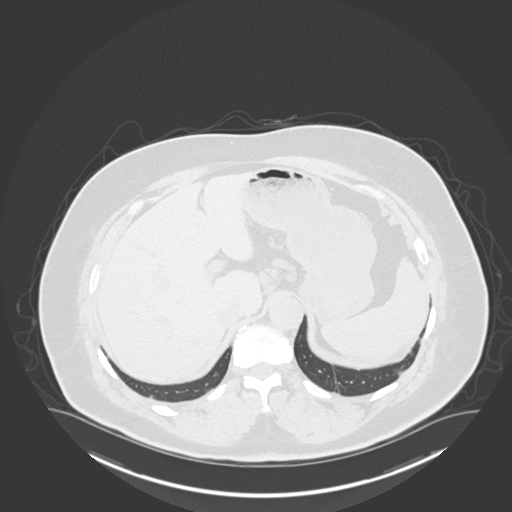
[im 99/132  soft-tissue]
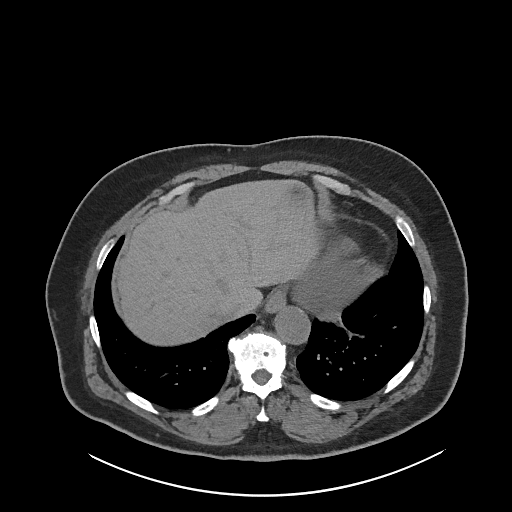
[im 99/132  lung]
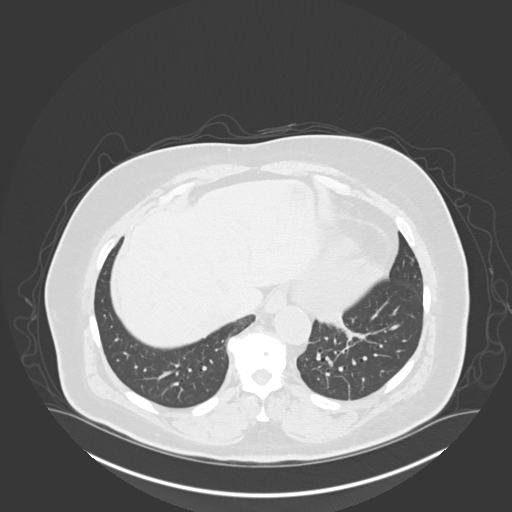
[im 115/132  soft-tissue]
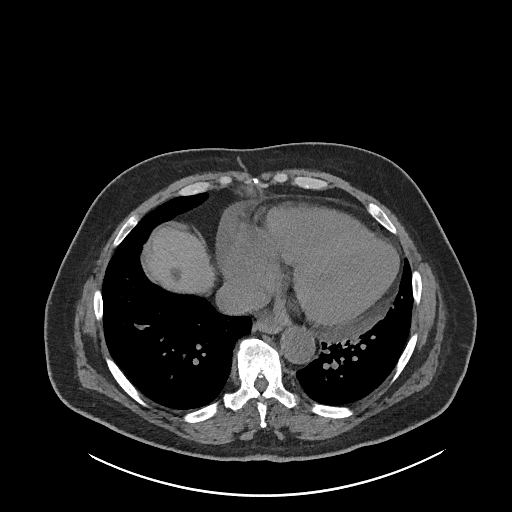
[im 115/132  lung]
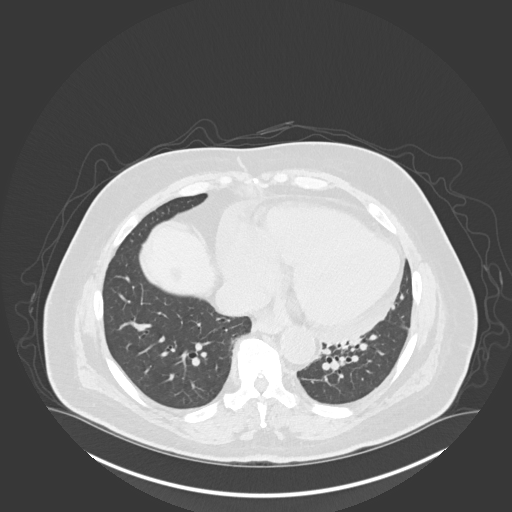

[Series 3: renal arterial · axial · arterial · 0.96mm/px · z∈[-1054,-1020]mm · 2 of 134 slices shown]
[im 17/134  soft-tissue]
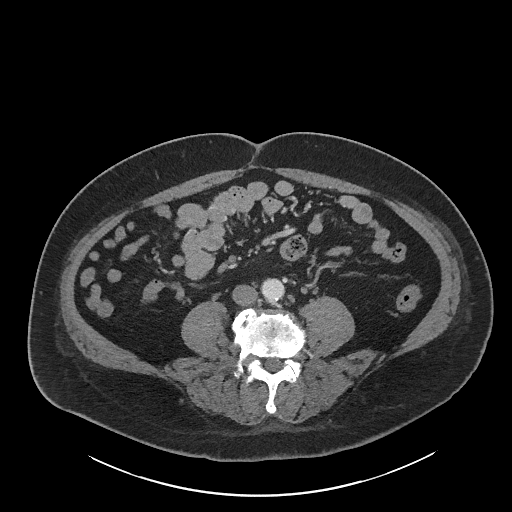
[im 34/134  soft-tissue]
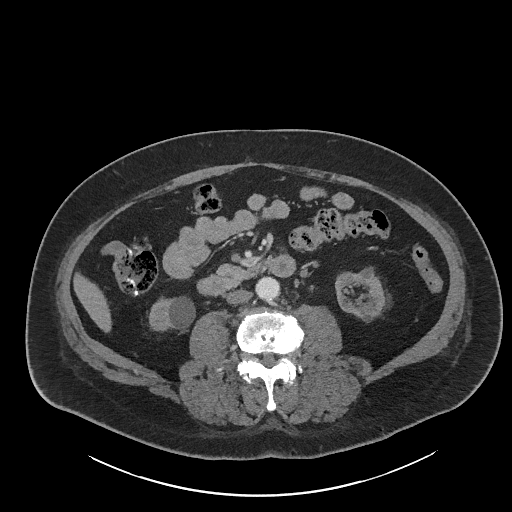

[Series 8: renal without cor · coronal · non-contrast · 0.53mm/px · 2 of 168 slices shown, 3 images]
[im 56/168  soft-tissue]
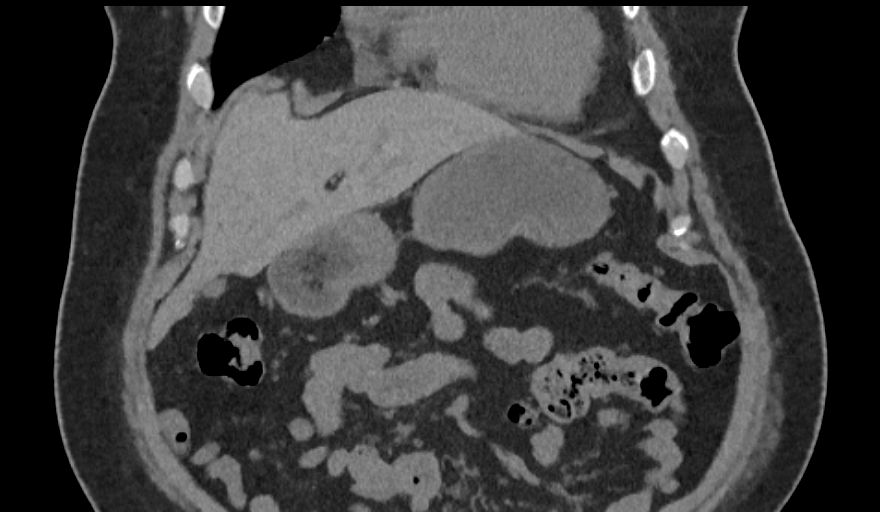
[im 56/168  bone]
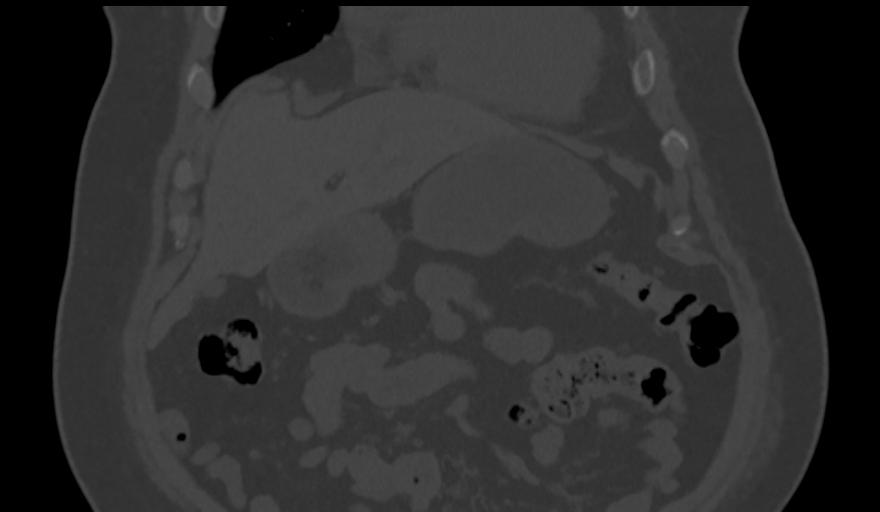
[im 112/168  soft-tissue]
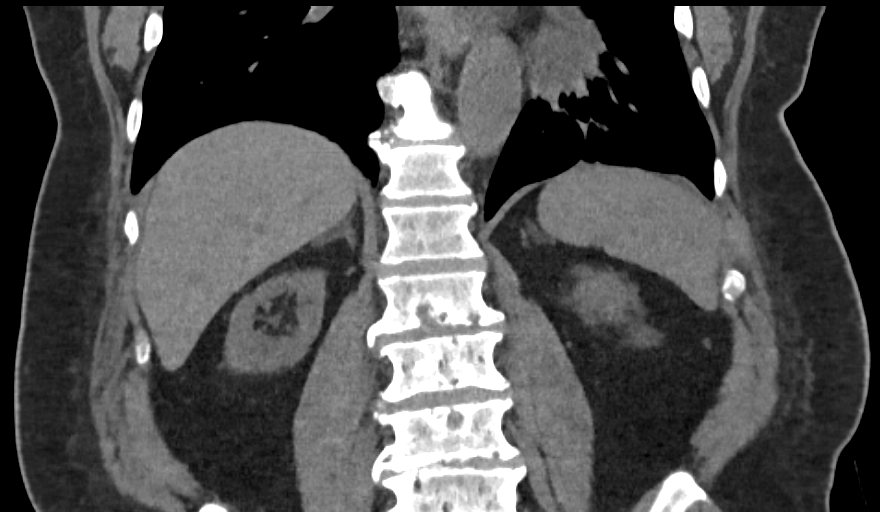

[11 of 46 positions shown; findings below may reference images not displayed]

FINDINGS: Lung bases: No acute findings. Central left lower lobe volume loss
is stable. Trace pericardial effusion noted.

Kidneys / Ureters / Bladder: Pre contrast imaging shows no stones in
either kidney. There is some atherosclerotic calcification in the
renal hila bilaterally. Imaging after IV contrast administration
shows stable bilateral renal cysts measuring up to 2.9 cm in the
right kidney and 4.9 cm in the left kidney. These are unchanged in
the interval. No enhancing lesion in either kidney. The area of
concern seen on the previous CT scan is now noted to represent a
lower pole calix which had filled with contrast early on the prior
study. Delayed images show poor opacification of the intrarenal
collecting systems, which limits assessment.

Other: 12 mm cyst in the dome of the liver is unchanged. Liver and
spleen otherwise unremarkable. The stomach, duodenum, pancreas and
adrenal glands are unremarkable. The gallbladder is decompressed. No
intra or extrahepatic biliary duct dilatation.

Bones / Musculoskeletal: No worrisome lytic or sclerotic osseous
abnormality.
IMPRESSION: No evidence for a right lower pole enhancing renal lesion on today's
study. Comparing anatomy of the right kidney on today's exam to that
of the previous study reveals that the hyper enhancement seen on the
previous study was secondary to the opacification of a lower pole
renal pyramid/calyx with excreted contrast.

## 2015-02-07 IMAGING — CR DG CHEST 1V PORT
1 series · 1 of 1 positions shown · non-contrast
Comparison: 10/25/2013

CLINICAL DATA: CHF

EXAM:
PORTABLE CHEST - 1 VIEW

[ap]
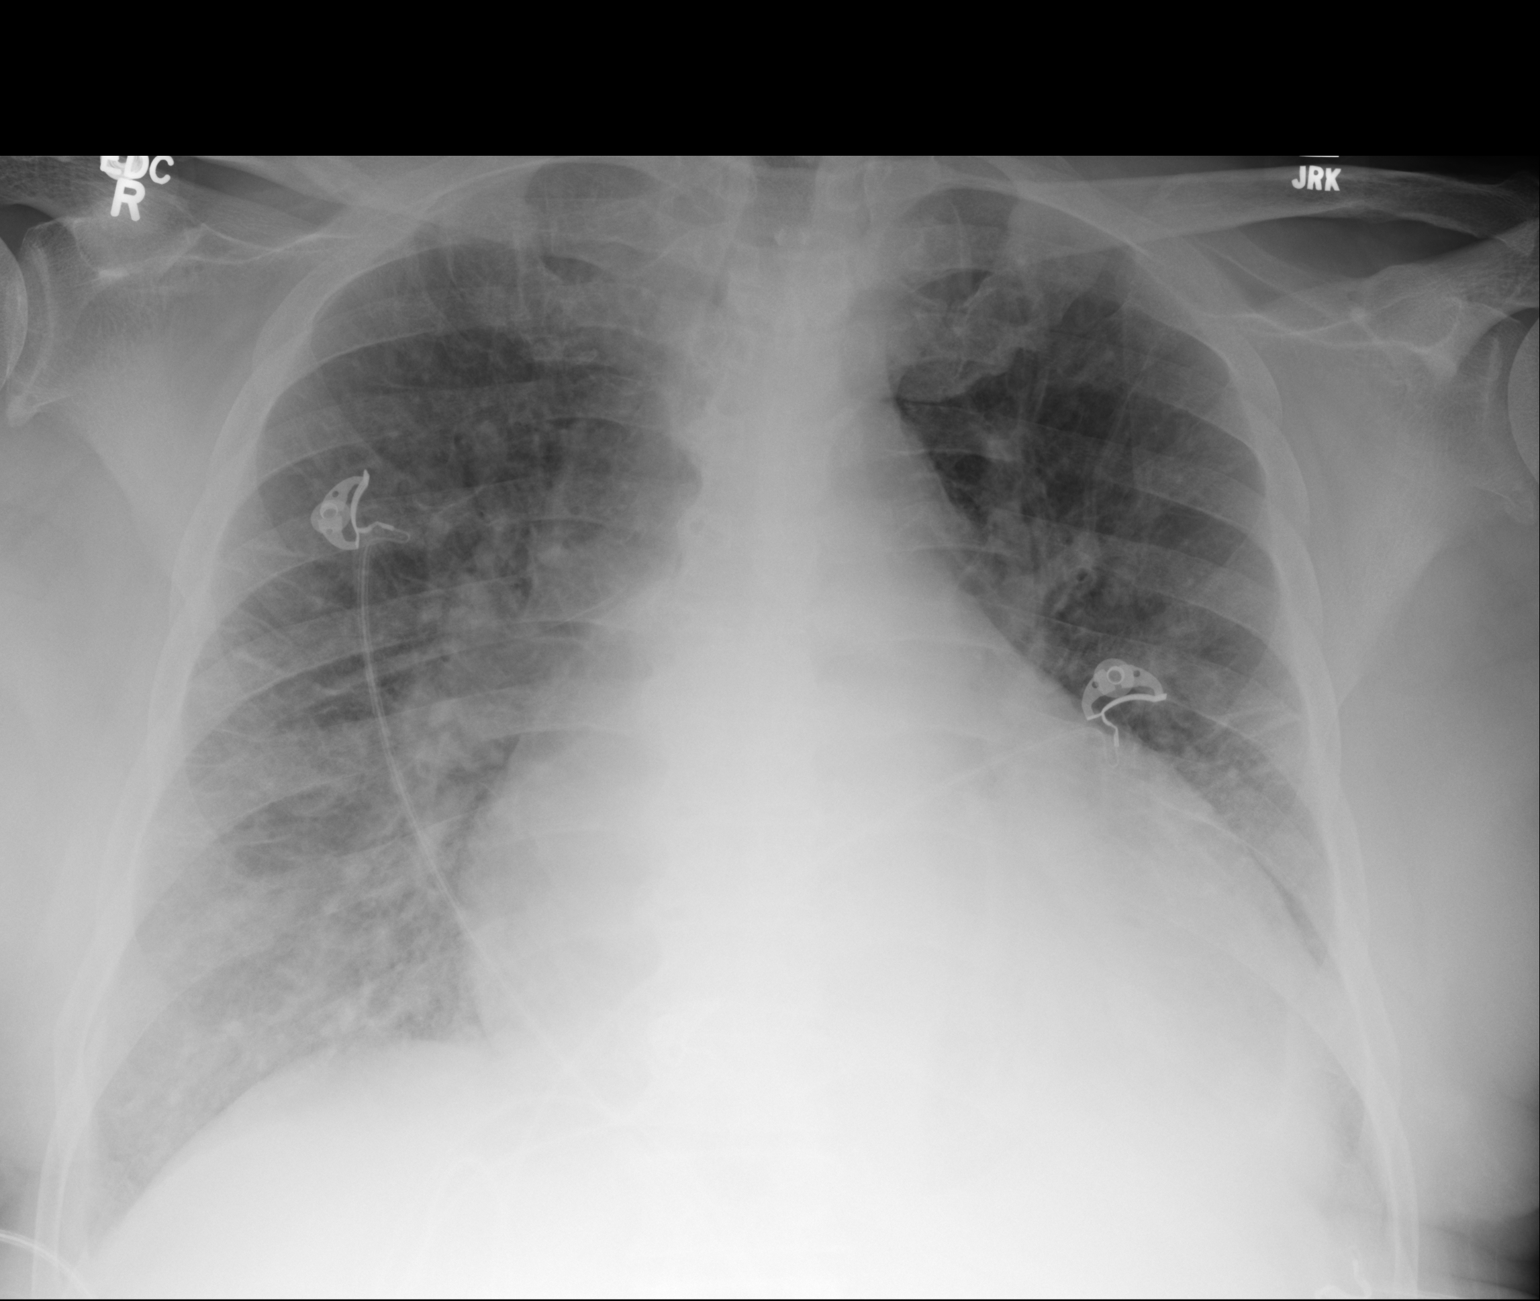

[1 of 1 positions shown; findings below may reference images not displayed]

FINDINGS: Cardiomegaly with pulmonary vascular congestion. Mild interstitial
edema is possible but is not convincing. Mild retrocardiac opacity,
likely atelectasis. No pleural effusion or pneumothorax.
IMPRESSION: Cardiomegaly with pulmonary vascular congestion and possible mild
interstitial edema.

Mild retrocardiac complexity, likely atelectasis.

## 2015-03-09 ENCOUNTER — Encounter: Payer: Self-pay | Admitting: Internal Medicine

## 2015-03-09 DIAGNOSIS — N529 Male erectile dysfunction, unspecified: Secondary | ICD-10-CM | POA: Insufficient documentation

## 2015-03-09 DIAGNOSIS — K219 Gastro-esophageal reflux disease without esophagitis: Secondary | ICD-10-CM | POA: Insufficient documentation

## 2015-03-09 DIAGNOSIS — I32 Pericarditis in diseases classified elsewhere: Secondary | ICD-10-CM

## 2015-03-09 DIAGNOSIS — N186 End stage renal disease: Secondary | ICD-10-CM | POA: Insufficient documentation

## 2015-03-09 DIAGNOSIS — I10 Essential (primary) hypertension: Secondary | ICD-10-CM | POA: Insufficient documentation

## 2015-03-09 DIAGNOSIS — Z8673 Personal history of transient ischemic attack (TIA), and cerebral infarction without residual deficits: Secondary | ICD-10-CM | POA: Insufficient documentation

## 2015-03-09 DIAGNOSIS — Z87898 Personal history of other specified conditions: Secondary | ICD-10-CM | POA: Insufficient documentation

## 2015-03-09 DIAGNOSIS — K279 Peptic ulcer, site unspecified, unspecified as acute or chronic, without hemorrhage or perforation: Secondary | ICD-10-CM | POA: Insufficient documentation

## 2015-03-09 DIAGNOSIS — M109 Gout, unspecified: Secondary | ICD-10-CM | POA: Insufficient documentation

## 2015-03-09 DIAGNOSIS — D649 Anemia, unspecified: Secondary | ICD-10-CM | POA: Insufficient documentation

## 2015-03-09 DIAGNOSIS — C61 Malignant neoplasm of prostate: Secondary | ICD-10-CM | POA: Insufficient documentation

## 2015-03-09 DIAGNOSIS — Z992 Dependence on renal dialysis: Secondary | ICD-10-CM
